# Patient Record
Sex: Female | Born: 1935 | Race: White | Hispanic: No | Smoking: Never smoker
Health system: Southern US, Community
[De-identification: ages and names within clinical notes are randomized; demographics above are authoritative.]

## PROBLEM LIST (undated history)

## (undated) DIAGNOSIS — Z950 Presence of cardiac pacemaker: Secondary | ICD-10-CM

## (undated) DIAGNOSIS — I10 Essential (primary) hypertension: Secondary | ICD-10-CM

## (undated) DIAGNOSIS — I499 Cardiac arrhythmia, unspecified: Secondary | ICD-10-CM

## (undated) DIAGNOSIS — I639 Cerebral infarction, unspecified: Secondary | ICD-10-CM

## (undated) DIAGNOSIS — I2510 Atherosclerotic heart disease of native coronary artery without angina pectoris: Secondary | ICD-10-CM

## (undated) HISTORY — PX: PACEMAKER INSERTION: SHX728

---

## 2005-09-26 ENCOUNTER — Other Ambulatory Visit: Payer: Self-pay

## 2005-09-26 ENCOUNTER — Inpatient Hospital Stay: Payer: Self-pay | Admitting: Internal Medicine

## 2005-09-30 ENCOUNTER — Other Ambulatory Visit: Payer: Self-pay

## 2005-10-19 ENCOUNTER — Encounter: Payer: Self-pay | Admitting: Unknown Physician Specialty

## 2005-11-29 ENCOUNTER — Emergency Department: Payer: Self-pay | Admitting: Emergency Medicine

## 2006-01-28 ENCOUNTER — Ambulatory Visit: Payer: Self-pay | Admitting: Internal Medicine

## 2007-02-04 ENCOUNTER — Ambulatory Visit: Payer: Self-pay | Admitting: Internal Medicine

## 2008-02-09 ENCOUNTER — Ambulatory Visit: Payer: Self-pay | Admitting: Internal Medicine

## 2009-02-11 ENCOUNTER — Ambulatory Visit: Payer: Self-pay | Admitting: Internal Medicine

## 2010-02-15 ENCOUNTER — Ambulatory Visit: Payer: Self-pay | Admitting: Internal Medicine

## 2011-02-20 ENCOUNTER — Ambulatory Visit: Payer: Self-pay | Admitting: Internal Medicine

## 2012-02-08 ENCOUNTER — Ambulatory Visit: Payer: Self-pay | Admitting: Ophthalmology

## 2012-02-08 DIAGNOSIS — I10 Essential (primary) hypertension: Secondary | ICD-10-CM

## 2012-02-08 LAB — PROTIME-INR: INR: 2.2

## 2012-02-08 LAB — POTASSIUM: Potassium: 3.7 mmol/L (ref 3.5–5.1)

## 2012-02-20 ENCOUNTER — Ambulatory Visit: Payer: Self-pay | Admitting: Ophthalmology

## 2012-03-05 ENCOUNTER — Ambulatory Visit: Payer: Self-pay | Admitting: Internal Medicine

## 2013-03-06 ENCOUNTER — Ambulatory Visit: Payer: Self-pay | Admitting: Internal Medicine

## 2013-04-09 ENCOUNTER — Ambulatory Visit: Payer: Self-pay | Admitting: Cardiology

## 2013-04-09 LAB — BASIC METABOLIC PANEL
Anion Gap: 6 — ABNORMAL LOW (ref 7–16)
Calcium, Total: 9.3 mg/dL (ref 8.5–10.1)
Chloride: 96 mmol/L — ABNORMAL LOW (ref 98–107)
Co2: 30 mmol/L (ref 21–32)
EGFR (African American): >60
Glucose: 98 mg/dL (ref 65–99)
Potassium: 3.7 mmol/L (ref 3.5–5.1)
Sodium: 132 mmol/L — ABNORMAL LOW (ref 136–145)

## 2013-04-09 LAB — CBC WITH DIFFERENTIAL/PLATELET
Basophil #: 0.1 10*3/uL (ref 0.0–0.1)
Basophil %: 0.9 %
Eosinophil #: 0.1 10*3/uL (ref 0.0–0.7)
HCT: 39 % (ref 35.0–47.0)
HGB: 13.6 g/dL (ref 12.0–16.0)
Lymphocyte #: 2.2 10*3/uL (ref 1.0–3.6)
MCH: 32.4 pg (ref 26.0–34.0)
MCV: 93 fL (ref 80–100)
Monocyte #: 0.5 x10 3/mm (ref 0.2–0.9)
Monocyte %: 6.8 %
Neutrophil #: 5 10*3/uL (ref 1.4–6.5)
Neutrophil %: 63.2 %
RDW: 12.7 % (ref 11.5–14.5)
WBC: 7.9 10*3/uL (ref 3.6–11.0)

## 2013-04-09 LAB — PROTIME-INR
INR: 2.9
Prothrombin Time: 29.6 secs — ABNORMAL HIGH (ref 11.5–14.7)

## 2013-04-09 LAB — APTT: Activated PTT: 44.6 secs — ABNORMAL HIGH (ref 23.6–35.9)

## 2013-04-16 ENCOUNTER — Ambulatory Visit: Payer: Self-pay | Admitting: Cardiology

## 2014-02-11 DIAGNOSIS — I4891 Unspecified atrial fibrillation: Secondary | ICD-10-CM | POA: Insufficient documentation

## 2014-03-10 ENCOUNTER — Ambulatory Visit: Payer: Self-pay | Admitting: Internal Medicine

## 2014-04-28 DIAGNOSIS — G459 Transient cerebral ischemic attack, unspecified: Secondary | ICD-10-CM | POA: Insufficient documentation

## 2014-04-28 DIAGNOSIS — Z950 Presence of cardiac pacemaker: Secondary | ICD-10-CM | POA: Insufficient documentation

## 2014-04-28 DIAGNOSIS — I10 Essential (primary) hypertension: Secondary | ICD-10-CM | POA: Insufficient documentation

## 2014-10-11 ENCOUNTER — Ambulatory Visit: Payer: Self-pay | Admitting: Otolaryngology

## 2014-11-01 DIAGNOSIS — E7800 Pure hypercholesterolemia, unspecified: Secondary | ICD-10-CM | POA: Insufficient documentation

## 2014-11-01 DIAGNOSIS — I495 Sick sinus syndrome: Secondary | ICD-10-CM | POA: Insufficient documentation

## 2014-11-01 DIAGNOSIS — Z8673 Personal history of transient ischemic attack (TIA), and cerebral infarction without residual deficits: Secondary | ICD-10-CM | POA: Insufficient documentation

## 2015-02-04 NOTE — Op Note (Signed)
PATIENT NAME:  [name], Brooke Potts DATE OF BIRTH:  [birth year]  DATE OF PROCEDURE:  04/16/2013  PRIMARY CARE PHYSICIAN:  Dr. Dan HumphreysWalker.   PREPROCEDURAL DIAGNOSES: 1.  Sick sinus syndrome.  2.  Elective replacement indication.   PROCEDURE:  Dual-chamber pacemaker generator change out.   POSTPROCEDURE DIAGNOSIS:  Intermittent ventricular pacing.   INDICATION:  The patient is an 81 year old female status post dual-chamber pacemaker in 1998 for sick sinus syndrome. Recent pacemaker interrogation has shown the pacemaker was at VVI pacing and at elective replacement indication. The procedure, the risks, benefits and alternatives of pacemaker generator change out were explained and informed written consent was obtained.   DESCRIPTION OF PROCEDURE:  She was brought to the Operating Room in a fasting state. The left pectoral region was prepped and draped in the usual sterile manner. Anesthesia was obtained with 1% Xylocaine locally. A 6 cm incision was performed over the old pacemaker generator site. The pacemaker generator was retrieved by electrocautery and blunt dissection. The leads were disconnected from the old pacemaker generator and interrogated. After proper thresholds were obtained, the leads were connected to a new rate-responsive pacemaker generator (Medtronic Adapta [id number]). The pacemaker pocket was irrigated with gentamicin solution. The pacemaker generator was positioned into the pocket. The pocket was closed with 2-0 and 4-0 Vicryl, respectively. Steri-Strips and pressure dressing were applied.   ____________________________ [name], MD ap:jm D: 04/16/2013 10:53:21 ET T: 04/16/2013 11:06:41 ET JOB#: [id number]  cc: [name], MD, <Dictator> [name] MD ELECTRONICALLY SIGNED 05/05/2013 12:28

## 2015-02-06 NOTE — Op Note (Signed)
PATIENT NAME:  Brooke GibbonMCDONALD, Jadie M MR#:  [id number] DATE OF BIRTH:  07/22/1923  DATE OF PROCEDURE:  02/20/2012  PREOPERATIVE DIAGNOSIS:  Senile cataract right eye.  POSTOPERATIVE DIAGNOSIS:  Senile cataract right eye.  PROCEDURE:  Phacoemulsification with posterior chamber intraocular lens implantation of the right eye.  LENS: ZCB00 21.5 diopter posterior chamber intraocular lens.  ULTRASOUND TIME:  13% of 1 minutes, 19 seconds for CDE 10.2.  SURGEON:  [place]had Janissa Bertram, MD  ANESTHESIA:  Topical with tetracaine drops and 2% Xylocaine jelly.  COMPLICATIONS:  None.  DESCRIPTION OF PROCEDURE:  The patient was identified in the holding room and transported to the operating room and placed in the supine position under the operating microscope.  The right eye was identified as the operative eye and it was prepped and draped in the usual sterile ophthalmic fashion.  A 1 millimeter clear-corneal paracentesis was made at the 12 o'clock  position.  The anterior chamber was filled with Viscoat viscoelastic.  A 2.4 millimeter keratome was used to make a near-clear corneal incision at the 9 o'clock  position.  A curvilinear capsulorrhexis was made with a cystotome and capsulorrhexis forceps.  Balanced salt solution was used to hydrodissect and hydrodelineate the nucleus.  Phacoemulsification was then used in horizontal chopping fashion to remove the lens nucleus and epinucleus.  The remaining cortex was then removed using the irrigation and aspiration handpiece. Provisc was then placed into the capsular bag to distend it for lens placement.  A ZCB00 21.5 diopter lens was then injected into the capsular bag.  The remaining viscoelastic was aspirated.  Wounds were hydrated with balanced salt solution.  The anterior chamber was inflated to a physiologic pressure with balanced salt solution.  Miostat was placed into the anterior chamber to constrict the pupil. No wound leaks were noted.  Topical Vigamox drops and  Maxitrol ointment were applied to the eye.  The patient was taken to the recovery room in stable condition without complications of anesthesia or surgery.  ____________________________ Deirdre Evenerhadwick R. Norton Bivins, MD crb:slb D: 02/20/2012 14:17:58 ET T: 02/20/2012 14:47:54 ET JOB#: [id number]  cc: Deirdre Evenerhadwick R. Rahkeem Senft, MD, <Dictator> Lockie MolaHADWICK Erica Osuna MD ELECTRONICALLY SIGNED 03/04/2012 11:32

## 2015-02-21 ENCOUNTER — Other Ambulatory Visit: Payer: Self-pay

## 2015-02-21 DIAGNOSIS — Z1231 Encounter for screening mammogram for malignant neoplasm of breast: Secondary | ICD-10-CM

## 2015-03-15 ENCOUNTER — Other Ambulatory Visit: Payer: Self-pay

## 2015-03-15 ENCOUNTER — Ambulatory Visit
Admission: RE | Admit: 2015-03-15 | Discharge: 2015-03-15 | Disposition: A | Discharge status: Home / Self Care | Payer: Medicare Other | Source: Ambulatory Visit | Attending: Internal Medicine | Admitting: Internal Medicine

## 2015-03-15 DIAGNOSIS — Z1231 Encounter for screening mammogram for malignant neoplasm of breast: Secondary | ICD-10-CM | POA: Insufficient documentation

## 2015-05-04 DIAGNOSIS — R42 Dizziness and giddiness: Secondary | ICD-10-CM | POA: Insufficient documentation

## 2016-02-06 ENCOUNTER — Other Ambulatory Visit: Payer: Self-pay

## 2016-02-06 ENCOUNTER — Other Ambulatory Visit: Payer: Self-pay | Admitting: Internal Medicine

## 2016-02-06 DIAGNOSIS — Z1231 Encounter for screening mammogram for malignant neoplasm of breast: Secondary | ICD-10-CM

## 2016-03-15 ENCOUNTER — Other Ambulatory Visit: Payer: Self-pay | Admitting: Internal Medicine

## 2016-03-15 ENCOUNTER — Ambulatory Visit
Admission: RE | Admit: 2016-03-15 | Discharge: 2016-03-15 | Disposition: A | Discharge status: Home / Self Care | Payer: Medicare Other | Source: Ambulatory Visit | Attending: Internal Medicine | Admitting: Internal Medicine

## 2016-03-15 DIAGNOSIS — Z1231 Encounter for screening mammogram for malignant neoplasm of breast: Secondary | ICD-10-CM

## 2016-10-02 ENCOUNTER — Other Ambulatory Visit: Payer: Self-pay | Admitting: Physician Assistant

## 2016-10-02 ENCOUNTER — Ambulatory Visit
Admission: RE | Admit: 2016-10-02 | Discharge: 2016-10-02 | Disposition: A | Discharge status: Home / Self Care | Payer: Medicare Other | Source: Ambulatory Visit | Attending: Physician Assistant | Admitting: Physician Assistant

## 2016-10-02 DIAGNOSIS — G319 Degenerative disease of nervous system, unspecified: Secondary | ICD-10-CM | POA: Insufficient documentation

## 2016-10-02 DIAGNOSIS — I739 Peripheral vascular disease, unspecified: Secondary | ICD-10-CM | POA: Insufficient documentation

## 2016-10-02 DIAGNOSIS — G9389 Other specified disorders of brain: Secondary | ICD-10-CM | POA: Insufficient documentation

## 2016-10-02 DIAGNOSIS — R519 Headache, unspecified: Secondary | ICD-10-CM

## 2016-10-02 DIAGNOSIS — R51 Headache: Secondary | ICD-10-CM | POA: Diagnosis not present

## 2016-10-02 DIAGNOSIS — Z8673 Personal history of transient ischemic attack (TIA), and cerebral infarction without residual deficits: Secondary | ICD-10-CM | POA: Diagnosis not present

## 2016-12-05 ENCOUNTER — Encounter: Payer: Self-pay | Admitting: *Deleted

## 2016-12-05 ENCOUNTER — Inpatient Hospital Stay
Admission: EM | Admit: 2016-12-05 | Discharge: 2016-12-11 | DRG: 378 | Disposition: A | Discharge status: Home / Self Care | Payer: Medicare Other | Attending: Internal Medicine | Admitting: Internal Medicine

## 2016-12-05 DIAGNOSIS — D62 Acute posthemorrhagic anemia: Secondary | ICD-10-CM | POA: Diagnosis present

## 2016-12-05 DIAGNOSIS — Z8249 Family history of ischemic heart disease and other diseases of the circulatory system: Secondary | ICD-10-CM

## 2016-12-05 DIAGNOSIS — H409 Unspecified glaucoma: Secondary | ICD-10-CM | POA: Diagnosis present

## 2016-12-05 DIAGNOSIS — K921 Melena: Secondary | ICD-10-CM

## 2016-12-05 DIAGNOSIS — Z950 Presence of cardiac pacemaker: Secondary | ICD-10-CM

## 2016-12-05 DIAGNOSIS — R791 Abnormal coagulation profile: Secondary | ICD-10-CM | POA: Diagnosis present

## 2016-12-05 DIAGNOSIS — I10 Essential (primary) hypertension: Secondary | ICD-10-CM | POA: Diagnosis present

## 2016-12-05 DIAGNOSIS — F419 Anxiety disorder, unspecified: Secondary | ICD-10-CM | POA: Diagnosis present

## 2016-12-05 DIAGNOSIS — Z823 Family history of stroke: Secondary | ICD-10-CM | POA: Diagnosis not present

## 2016-12-05 DIAGNOSIS — I2510 Atherosclerotic heart disease of native coronary artery without angina pectoris: Secondary | ICD-10-CM | POA: Diagnosis present

## 2016-12-05 DIAGNOSIS — K5731 Diverticulosis of large intestine without perforation or abscess with bleeding: Secondary | ICD-10-CM | POA: Diagnosis present

## 2016-12-05 DIAGNOSIS — Z7901 Long term (current) use of anticoagulants: Secondary | ICD-10-CM

## 2016-12-05 DIAGNOSIS — Z833 Family history of diabetes mellitus: Secondary | ICD-10-CM | POA: Diagnosis not present

## 2016-12-05 DIAGNOSIS — I4891 Unspecified atrial fibrillation: Secondary | ICD-10-CM | POA: Diagnosis present

## 2016-12-05 DIAGNOSIS — K922 Gastrointestinal hemorrhage, unspecified: Secondary | ICD-10-CM | POA: Diagnosis present

## 2016-12-05 DIAGNOSIS — D649 Anemia, unspecified: Secondary | ICD-10-CM

## 2016-12-05 HISTORY — DX: Cardiac arrhythmia, unspecified: I49.9

## 2016-12-05 HISTORY — DX: Atherosclerotic heart disease of native coronary artery without angina pectoris: I25.10

## 2016-12-05 HISTORY — DX: Essential (primary) hypertension: I10

## 2016-12-05 HISTORY — DX: Presence of cardiac pacemaker: Z95.0

## 2016-12-05 LAB — CBC
HCT: 22.1 % — ABNORMAL LOW (ref 35.0–47.0)
HEMOGLOBIN: 7.3 g/dL — AB (ref 12.0–16.0)
MCH: 30.7 pg (ref 26.0–34.0)
MCHC: 33.3 g/dL (ref 32.0–36.0)
MCV: 92.3 fL (ref 80.0–100.0)
Platelets: 372 10*3/uL (ref 150–440)
RBC: 2.39 MIL/uL — ABNORMAL LOW (ref 3.80–5.20)
RDW: 13.5 % (ref 11.5–14.5)
WBC: 9.4 10*3/uL (ref 3.6–11.0)

## 2016-12-05 LAB — PROTIME-INR
INR: 3.86
Prothrombin Time: 38.9 seconds — ABNORMAL HIGH (ref 11.4–15.2)

## 2016-12-05 LAB — BASIC METABOLIC PANEL
Anion gap: 9 (ref 5–15)
BUN: 34 mg/dL — ABNORMAL HIGH (ref 6–20)
CO2: 22 mmol/L (ref 22–32)
Calcium: 9 mg/dL (ref 8.9–10.3)
Chloride: 106 mmol/L (ref 101–111)
Creatinine, Ser: 1.25 mg/dL — ABNORMAL HIGH (ref 0.44–1.00)
GFR calc Af Amer: 42 mL/min — ABNORMAL LOW (ref >60)
GFR calc non Af Amer: 36 mL/min — ABNORMAL LOW (ref >60)
GLUCOSE: 129 mg/dL — AB (ref 65–99)
POTASSIUM: 4 mmol/L (ref 3.5–5.1)
Sodium: 137 mmol/L (ref 135–145)

## 2016-12-05 LAB — URINALYSIS, COMPLETE (UACMP) WITH MICROSCOPIC
Bilirubin Urine: NEGATIVE
Glucose, UA: NEGATIVE mg/dL
Ketones, ur: NEGATIVE mg/dL
NITRITE: NEGATIVE
PROTEIN: NEGATIVE mg/dL
Specific Gravity, Urine: 1.014 (ref 1.005–1.030)
pH: 6 (ref 5.0–8.0)

## 2016-12-05 LAB — GLUCOSE, CAPILLARY: GLUCOSE-CAPILLARY: 124 mg/dL — AB (ref 65–99)

## 2016-12-05 LAB — PREPARE RBC (CROSSMATCH)

## 2016-12-05 LAB — ABO/RH: ABO/RH(D): O POS

## 2016-12-05 MED ORDER — LISINOPRIL 20 MG PO TABS
20.0000 mg | ORAL_TABLET | Freq: Every day | ORAL | Status: DC
  Administered 2016-12-06 – 2016-12-10 (×4): 20 mg via ORAL
  Filled 2016-12-05 (×5): qty 1

## 2016-12-05 MED ORDER — SODIUM CHLORIDE 0.9 % IV SOLN
10.0000 mL/h | Freq: Once | INTRAVENOUS | Status: AC
  Administered 2016-12-05: 10 mL/h via INTRAVENOUS

## 2016-12-05 MED ORDER — LISINOPRIL-HYDROCHLOROTHIAZIDE 20-25 MG PO TABS: 1.0000 | ORAL_TABLET | Freq: Every day | ORAL | Status: DC

## 2016-12-05 MED ORDER — PANTOPRAZOLE SODIUM 40 MG IV SOLR: 40.0000 mg | Freq: Two times a day (BID) | INTRAVENOUS | Status: DC

## 2016-12-05 MED ORDER — ONDANSETRON HCL 4 MG PO TABS: 4.0000 mg | ORAL_TABLET | Freq: Four times a day (QID) | ORAL | Status: DC | PRN

## 2016-12-05 MED ORDER — ALPRAZOLAM 0.25 MG PO TABS
0.2500 mg | ORAL_TABLET | Freq: Three times a day (TID) | ORAL | Status: DC | PRN
  Administered 2016-12-05 – 2016-12-10 (×7): 0.25 mg via ORAL
  Filled 2016-12-05 (×7): qty 1

## 2016-12-05 MED ORDER — ACETAMINOPHEN 650 MG RE SUPP
650.0000 mg | Freq: Four times a day (QID) | RECTAL | Status: DC | PRN
Start: 2016-12-05 — End: 2016-12-11

## 2016-12-05 MED ORDER — ONDANSETRON HCL 4 MG/2ML IJ SOLN: 4.0000 mg | Freq: Four times a day (QID) | INTRAMUSCULAR | Status: DC | PRN

## 2016-12-05 MED ORDER — LATANOPROST 0.005 % OP SOLN
1.0000 [drp] | Freq: Every day | OPHTHALMIC | Status: DC
  Administered 2016-12-05 – 2016-12-10 (×6): 1 [drp] via OPHTHALMIC
  Filled 2016-12-05: qty 2.5

## 2016-12-05 MED ORDER — PANTOPRAZOLE SODIUM 40 MG IV SOLR
40.0000 mg | Freq: Two times a day (BID) | INTRAVENOUS | Status: DC
  Administered 2016-12-05 – 2016-12-10 (×10): 40 mg via INTRAVENOUS
  Filled 2016-12-05 (×11): qty 40

## 2016-12-05 MED ORDER — SODIUM CHLORIDE 0.9 % IV SOLN
INTRAVENOUS | Status: DC
  Administered 2016-12-05 – 2016-12-08 (×4): via INTRAVENOUS

## 2016-12-05 MED ORDER — ACETAMINOPHEN 325 MG PO TABS
650.0000 mg | ORAL_TABLET | Freq: Four times a day (QID) | ORAL | Status: DC | PRN
  Administered 2016-12-05 – 2016-12-10 (×6): 650 mg via ORAL
  Filled 2016-12-05 (×6): qty 2

## 2016-12-05 MED ORDER — HYDROCHLOROTHIAZIDE 25 MG PO TABS
25.0000 mg | ORAL_TABLET | Freq: Every day | ORAL | Status: DC
  Administered 2016-12-06 – 2016-12-10 (×4): 25 mg via ORAL
  Filled 2016-12-05 (×5): qty 1

## 2016-12-05 NOTE — ED Triage Notes (Signed)
Pt complains of generalized weakness, pt states" I feel like my muscles left my body"

## 2016-12-05 NOTE — H&P (Signed)
[facility] [place] [facility]   PATIENT NAME: Brooke SeenLula Potts    MR#:  [id number]  DATE OF BIRTH:  25-Jul-1923  DATE OF ADMISSION:  12/05/2016  PRIMARY CARE PHYSICIAN: Rafael BihariWALKER III, JOHN B, MD   REQUESTING/REFERRING PHYSICIAN: Dr. Daryel NovemberJonathan Williams  CHIEF COMPLAINT:   Chief Complaint  Patient presents with  . Fatigue    HISTORY OF PRESENT ILLNESS:  Brooke Potts  is a 83 y.o. female with a known history of Coronary artery disease, hypertension, history of atrial fibrillation status post pacemaker who presents to the hospital due to weakness, fatigue ongoing for the past few weeks. Patient says that she gets significantly short of breath when walking up her stairs and also gets dizzy and lightheaded. She went to see her primary care physician today who did some routine blood work and noted her to be significantly anemic and sent her to the ER for further evaluation. Patient does state that she's been seeing dark/melanotic stools now for a few weeks. She denies any abdominal pain, chest pain, nausea vomiting or any other associated symptoms present. She was noted to be significantly anemic and symptomatic with it and suspected to have an upper GI bleed and hospitalist services were contacted further treatment and evaluation.  PAST MEDICAL HISTORY:   Past Medical History:  Diagnosis Date  . Coronary artery disease   . Hypertension   . Pacemaker     PAST SURGICAL HISTORY:   Past Surgical History:  Procedure Laterality Date  . PACEMAKER INSERTION      SOCIAL HISTORY:   Social History  Substance Use Topics  . Smoking status: Never Smoker  . Smokeless tobacco: Not on file  . Alcohol use No    FAMILY HISTORY:   Family History  Problem Relation Age of Onset  . Breast cancer Mother [id number]  . Peripheral vascular disease Mother   . Diabetes Mother   . Stroke Father   . Diabetes Father   . Diabetes Sister   . Diabetes Brother     DRUG ALLERGIES:  No Known  Allergies  REVIEW OF SYSTEMS:   Review of Systems  Constitutional: Positive for malaise/fatigue. Negative for fever and weight loss.  HENT: Negative for congestion, nosebleeds and tinnitus.   Eyes: Negative for blurred vision, double vision and redness.  Respiratory: Negative for cough, hemoptysis and shortness of breath.   Cardiovascular: Negative for chest pain, orthopnea, leg swelling and PND.  Gastrointestinal: Positive for blood in stool and melena. Negative for abdominal pain, diarrhea, nausea and vomiting.  Genitourinary: Negative for dysuria, hematuria and urgency.  Musculoskeletal: Negative for falls and joint pain.  Neurological: Positive for dizziness and weakness. Negative for tingling, sensory change, focal weakness, seizures and headaches.  Endo/Heme/Allergies: Negative for polydipsia. Does not bruise/bleed easily.  Psychiatric/Behavioral: Negative for depression and memory loss. The patient is not nervous/anxious.     MEDICATIONS AT HOME:   Prior to Admission medications   Medication Sig Start Date End Date Taking? Authorizing Provider  acetaminophen (TYLENOL) 325 MG tablet Take 650 mg by mouth every 6 (six) hours as needed.   Yes Historical Provider, MD  ALPRAZolam (XANAX) 0.25 MG tablet Take 0.25 mg by mouth 3 (three) times daily as needed. 10/13/16  Yes Historical Provider, MD  Cranberry 450 MG TABS Take 450 mg by mouth 2 (two) times daily.   Yes Historical Provider, MD  latanoprost (XALATAN) 0.005 % ophthalmic solution Place 1 drop into both eyes at bedtime. 09/19/14  Yes Historical  Provider, MD  lisinopril-hydrochlorothiazide (PRINZIDE,ZESTORETIC) 20-25 MG tablet Take 1 tablet by mouth daily. 11/22/16  Yes Historical Provider, MD  warfarin (COUMADIN) 2 MG tablet Take 2 mg by mouth daily. 10/16/16  Yes Historical Provider, MD      VITAL SIGNS:  Blood pressure 135/68, pulse (!) 105, temperature 97.8 F (36.6 C), temperature source Oral, resp. rate (!) 28, height 5'  (1.524 m), weight 52.6 kg (116 lb), SpO2 100 %.  PHYSICAL EXAMINATION:  Physical Exam  GENERAL:  83 y.o.-year-old patient lying in the bed in no acute distress.  EYES: Pupils equal, round, reactive to light and accommodation. No scleral icterus. Extraocular muscles intact. Pale conjunctiva.  HEENT: Head atraumatic, normocephalic. Oropharynx and nasopharynx clear. No oropharyngeal erythema, moist oral mucosa  NECK:  Supple, no jugular venous distention. No thyroid enlargement, no tenderness.  LUNGS: Normal breath sounds bilaterally, no wheezing, rales, rhonchi. No use of accessory muscles of respiration.  CARDIOVASCULAR: S1, S2 RRR. No murmurs, rubs, gallops, clicks.  ABDOMEN: Soft, nontender, nondistended. Bowel sounds present. No organomegaly or mass.  EXTREMITIES: No pedal edema, cyanosis, or clubbing. + 2 pedal & radial pulses b/l.   NEUROLOGIC: Cranial nerves II through XII are intact. No focal Motor or sensory deficits appreciated b/l. Globally weak PSYCHIATRIC: The patient is alert and oriented x 3. SKIN: No obvious rash, lesion, or ulcer.   LABORATORY PANEL:   CBC  Recent Labs Lab 12/05/16 1033  WBC 9.4  HGB 7.3*  HCT 22.1*  PLT 372   ------------------------------------------------------------------------------------------------------------------  Chemistries   Recent Labs Lab 12/05/16 1033  NA 137  K 4.0  CL 106  CO2 22  GLUCOSE 129*  BUN 34*  CREATININE 1.25*  CALCIUM 9.0   ------------------------------------------------------------------------------------------------------------------  Cardiac Enzymes No results for input(s): TROPONINI in the last 168 hours. ------------------------------------------------------------------------------------------------------------------  RADIOLOGY:  No results found.   IMPRESSION AND PLAN:   83 year old female with past medical history of hypertension, atrial fibrillation status post pacemaker me anxiety, glaucoma  who presents to the hospital due to weakness and dizziness and noted to be anemic and also noted to have heme positive stools.  1. GI bleed-this is a suspected upper GI bleed given the patient's melanotic stools. -Given the patient symptomatically anemia she'll be transfused 1 unit of packed red blood cells. Follow hemoglobin. -Place on IV Protonix, clear liquid diet. I will get a gastroenterology consult.  2. Anemia-this is acute blood loss anemia secondary to GI bleed.  -patient will be transfused 1 unit of packed red blood cells, will follow hemoglobin.  -hold Coumadin.  3. History of atrial fibrillation-patient is rate controlled and is status post pacemaker -Hold Coumadin given the GI bleed and anemia.  4. Essential hypertension-continue a Lisinopril/HCTZ.  5. Glaucoma-continue latanoprost eyedrops  6. Anxiety-continue as needed Xanax.    All the records are reviewed and case discussed with ED provider. Management plans discussed with the patient, family and they are in agreement.  CODE STATUS: Full code  TOTAL TIME TAKING CARE OF THIS PATIENT: 45 minutes.    Houston Siren M.D on 12/05/2016 at 1:08 PM  Between 7am to 6pm - Pager - [phone]  After 6pm go to [email or web address] - password [email or web address] [facility]  [facility]  Office  [phone]  CC: Primary care physician; Rafael Bihari, MD

## 2016-12-05 NOTE — ED Notes (Signed)
[facility]  Pt alert.  Pt waiting on bed assignment.  Family with pt.

## 2016-12-05 NOTE — ED Notes (Signed)
ED Provider at bedside. 

## 2016-12-05 NOTE — ED Notes (Signed)
Rectal exam performed by Dr.Williams, assisted by medic Tammy J

## 2016-12-05 NOTE — ED Provider Notes (Addendum)
[facility] Emergency Department Provider Note        Time seen: ----------------------------------------- 10:45 AM on 12/05/2016 -----------------------------------------    I have reviewed the triage vital signs and the nursing notes.   HISTORY  Chief Complaint Fatigue    HPI Brooke Potts is a 83 y.o. female who presents to ER for generalized weakness, states she feels like muscles left her body.Patient describes progressive symptom onset that has been there for "a while". Patient denies fevers, chills, chest pain but does have shortness of breath particularly with ambulation. Patient states she can't walk to the bathroom without being short winded. She denies fevers, chills or other complaints. She also notes she's had black stools for "a while".   Past Medical History:  Diagnosis Date  . Coronary artery disease   . Hypertension     There are no active problems to display for this patient.   Past Surgical History:  Procedure Laterality Date  . PACEMAKER INSERTION      Allergies Patient has no known allergies.  Social History Social History  Substance Use Topics  . Smoking status: Never Smoker  . Smokeless tobacco: Not on file  . Alcohol use No    Review of Systems Constitutional: Negative for fever. Cardiovascular: Negative for chest pain. Respiratory: Positive for shortness of breath Gastrointestinal: Negative for abdominal pain, vomiting and diarrhea.Positive for black stools Genitourinary: Negative for dysuria. Musculoskeletal: Negative for back pain. Skin: Negative for rash. Neurological: Negative for headaches, positive for weakness  10-point ROS otherwise negative.  ____________________________________________   PHYSICAL EXAM:  VITAL SIGNS: ED Triage Vitals  Enc Vitals Group     BP 12/05/16 1026 135/68     Pulse Rate 12/05/16 1027 (!) 101     Resp --      Temp 12/05/16 1028 97.8 F (36.6 C)     Temp Source  12/05/16 1028 Oral     SpO2 12/05/16 1027 100 %     Weight 12/05/16 1029 116 lb (52.6 kg)     Height 12/05/16 1029 5' (1.524 m)     Head Circumference --      Peak Flow --      Pain Score --      Pain Loc --      Pain Edu? --      Excl. in GC? --     Constitutional: Alert and oriented. Well appearing and in no distress. Eyes: Conjunctivae are Pale. PERRL. Normal extraocular movements. ENT   Head: Normocephalic and atraumatic.   Nose: No congestion/rhinnorhea.   Mouth/Throat: Mucous membranes are moist.   Neck: No stridor. Cardiovascular: Irregularly irregular rhythm. No murmurs, rubs, or gallops. Respiratory: Normal respiratory effort without tachypnea nor retractions. Breath sounds are clear and equal bilaterally. No wheezes/rales/rhonchi. Gastrointestinal: Soft and nontender. Normal bowel sounds Rectal: Hard stool, black, heme positive Musculoskeletal: Nontender with normal range of motion in all extremities. No lower extremity tenderness nor edema. Neurologic:  Normal speech and language. No gross focal neurologic deficits are appreciated.  Skin:  Skin is warm, dry and intact. Pallor is noted Psychiatric: Mood and affect are normal. Speech and behavior are normal.  ____________________________________________  EKG: Interpreted by me. Atrial fibrillation with ventricular paced complexes  ____________________________________________  ED COURSE:  Pertinent labs & imaging results that were available during my care of the patient were reviewed by me and considered in my medical decision making (see chart for details). Patient presents to ER with weakness. We will assess with  labs and imaging.   Procedures ____________________________________________   LABS (pertinent positives/negatives)  Labs Reviewed  BASIC METABOLIC PANEL - Abnormal; Notable for the following:       Result Value   Glucose, Bld 129 (*)    BUN 34 (*)    Creatinine, Ser 1.25 (*)    GFR calc  non Af Amer 36 (*)    GFR calc Af Amer 42 (*)    All other components within normal limits  CBC - Abnormal; Notable for the following:    RBC 2.39 (*)    Hemoglobin 7.3 (*)    HCT 22.1 (*)    All other components within normal limits  URINALYSIS, COMPLETE (UACMP) WITH MICROSCOPIC - Abnormal; Notable for the following:    Color, Urine YELLOW (*)    APPearance CLOUDY (*)    Hgb urine dipstick LARGE (*)    Leukocytes, UA LARGE (*)    Bacteria, UA MANY (*)    Squamous Epithelial / LPF 6-30 (*)    All other components within normal limits  GLUCOSE, CAPILLARY - Abnormal; Notable for the following:    Glucose-Capillary 124 (*)    All other components within normal limits  PROTIME-INR - Abnormal; Notable for the following:    Prothrombin Time 38.9 (*)    All other components within normal limits  CBG MONITORING, ED  TYPE AND SCREEN  TYPE AND SCREEN   ____________________________________________  FINAL ASSESSMENT AND PLAN  Weakness, anemia, Heme positive stool  Plan: Patient with labs and imaging as dictated above. Patient likely with upper GI bleeding complicated by or made worse by concurrent use of Coumadin for atrial fibrillation. She will need to be admitted, holding Coumadin and consider blood transfusion. I will discuss with the hospitalist for admission.   Emily FilbertWilliams, Jonathan E, MD   Note: This note was generated in part or whole with voice recognition software. Voice recognition is usually quite accurate but there are transcription errors that can and very often do occur. I apologize for any typographical errors that were not detected and corrected.     Emily FilbertJonathan E Williams, MD 12/05/16 1204    Emily FilbertJonathan E Williams, MD 12/05/16 [phone]

## 2016-12-05 NOTE — ED Notes (Signed)
Admit Provider at bedside. 

## 2016-12-05 NOTE — ED Notes (Addendum)
Generalized weakness "for months" per patient. "feels like my muscles have left my body". Active ROM. Pt drove herself here. Pt able to stand and get into stretcher.

## 2016-12-05 NOTE — ED Notes (Signed)
Recollect of Type and screen completed

## 2016-12-05 NOTE — ED Notes (Signed)
Informed consent, signed by pt , family at bedside to witness.

## 2016-12-05 NOTE — Consult Note (Signed)
Wyline MoodKiran Andalyn Heckstall MD  [address] [place], [place] [address] Phone: [phone] Fax : [phone]  Consultation  Referring Provider:     No ref. provider found Primary Care Physician:  Rafael BihariWALKER III, JOHN B, MD Primary Gastroenterologist:      None     Reason for Consultation:     GI bleed  Date of Admission:  12/05/2016 Date of Consultation:  12/05/2016         HPI:   Delman CheadleLula Mae Potts is a 83 y.o. female who is on coumadin for atrial fibrillation presented to the hospital today with weakness , fatigue for the past few weeks. I have been consulted because she mentioned that she had dark stools and was found to have a drop in her Hb to 7.3 grams from baseline of 13 a few years back on our records. MCV is normal . INR elevated 3.86   She says prior to 09/2016 she could walk 12 mils a day but since December getting more tired, unable to walk for long , noticed tarry black stools, which persisted. Denies use of any NSAID's, complains of palpitations on and off. No abdominal pain presently.   Past Medical History:  Diagnosis Date  . Coronary artery disease   . Hypertension   . Pacemaker     Past Surgical History:  Procedure Laterality Date  . PACEMAKER INSERTION      Prior to Admission medications   Medication Sig Start Date End Date Taking? Authorizing Provider  acetaminophen (TYLENOL) 325 MG tablet Take 650 mg by mouth every 6 (six) hours as needed.   Yes Historical Provider, MD  ALPRAZolam (XANAX) 0.25 MG tablet Take 0.25 mg by mouth 3 (three) times daily as needed. 10/13/16  Yes Historical Provider, MD  Cranberry 450 MG TABS Take 450 mg by mouth 2 (two) times daily.   Yes Historical Provider, MD  latanoprost (XALATAN) 0.005 % ophthalmic solution Place 1 drop into both eyes at bedtime. 09/19/14  Yes Historical Provider, MD  lisinopril-hydrochlorothiazide (PRINZIDE,ZESTORETIC) 20-25 MG tablet Take 1 tablet by mouth daily. 11/22/16  Yes Historical Provider, MD  warfarin (COUMADIN) 2 MG tablet  Take 2 mg by mouth daily. 10/16/16  Yes Historical Provider, MD    Family History  Problem Relation Age of Onset  . Breast cancer Mother 967  . Peripheral vascular disease Mother   . Diabetes Mother   . Stroke Father   . Diabetes Father   . Diabetes Sister   . Diabetes Brother      Social History  Substance Use Topics  . Smoking status: Never Smoker  . Smokeless tobacco: Not on file  . Alcohol use No    Allergies as of 12/05/2016  . (No Known Allergies)    Review of Systems:    All systems reviewed and negative except where noted in HPI.   Physical Exam:  Vital signs in last 24 hours: Temp:  [97.8 F (36.6 C)] 97.8 F (36.6 C) (02/21 1028) Pulse Rate:  [86-105] 94 (02/21 1500) Resp:  [15-28] 18 (02/21 1500) BP: (135-145)/(61-68) 145/61 (02/21 1500) SpO2:  [100 %] 100 % (02/21 1500) Weight:  [116 lb (52.6 kg)] 116 lb (52.6 kg) (02/21 1029)   General:   Pleasant, cooperative in NAD, lying comfortably in her bed Head:  Normocephalic and atraumatic. Eyes:   No icterus.   Conjunctiva pink. PERRLA. Ears:  Normal auditory acuity. Neck:  Supple; no masses or thyroidomegaly Lungs: Respirations even and unlabored. Lungs clear to auscultation bilaterally.  No wheezes, crackles, or rhonchi.  Heart:  Irregularly irregular heart rate, systolic murmur heard over precordium Abdomen:  Soft, nondistended, nontender. Normal bowel sounds. No appreciable masses or hepatomegaly.  No rebound or guarding.  Rectal:  Not performed.  Extremities:  Without edema, cyanosis or clubbing. Neurologic:  Alert and oriented x3;  grossly normal neurologically. Skin:  Intact without significant lesions or rashes. Cervical Nodes:  No significant cervical adenopathy. Psych:  Alert and cooperative. Normal affect.  LAB RESULTS:  Recent Labs  12/05/16 1033  WBC 9.4  HGB 7.3*  HCT 22.1*  PLT 372   BMET  Recent Labs  12/05/16 1033  NA 137  K 4.0  CL 106  CO2 22  GLUCOSE 129*  BUN 34*    CREATININE 1.25*  CALCIUM 9.0   LFT No results for input(s): PROT, ALBUMIN, AST, ALT, ALKPHOS, BILITOT, BILIDIR, IBILI in the last 72 hours. PT/INR  Recent Labs  12/05/16 1116  LABPROT 38.9*  INR 3.86    STUDIES: No results found.    Impression / Plan:   Brooke Potts is a 83 y.o. y/o female admitted with symptomatic anemia , supratherapeutic inr due to coumadin for atrial fibrillation . History suggestive of an upper GI bleed.   Plan   1. IV PPI, clear liquid diet  2. H pylori stool antigen  3. Monitor CBC and transfuse as needed  4. Hold coumadin and when INR < 1.5 will discuss with patient about possible endoscopy .   5. Moving forward decision needs to be made about the risks of anticoagulation vs risk of stroke and her age .  Thank you for involving me in the care of this patient.      LOS: 0 days   Wyline Mood, MD  12/05/2016, 4:17 PM

## 2016-12-06 LAB — CBC
HCT: 22.6 % — ABNORMAL LOW (ref 35.0–47.0)
Hemoglobin: 7.8 g/dL — ABNORMAL LOW (ref 12.0–16.0)
MCH: 31.7 pg (ref 26.0–34.0)
MCHC: 34.7 g/dL (ref 32.0–36.0)
MCV: 91.2 fL (ref 80.0–100.0)
PLATELETS: 242 10*3/uL (ref 150–440)
RBC: 2.48 MIL/uL — AB (ref 3.80–5.20)
RDW: 13.3 % (ref 11.5–14.5)
WBC: 6.5 10*3/uL (ref 3.6–11.0)

## 2016-12-06 LAB — BASIC METABOLIC PANEL
Anion gap: 4 — ABNORMAL LOW (ref 5–15)
BUN: 27 mg/dL — ABNORMAL HIGH (ref 6–20)
CHLORIDE: 113 mmol/L — AB (ref 101–111)
CO2: 25 mmol/L (ref 22–32)
CREATININE: 1.11 mg/dL — AB (ref 0.44–1.00)
Calcium: 8.2 mg/dL — ABNORMAL LOW (ref 8.9–10.3)
GFR calc non Af Amer: 42 mL/min — ABNORMAL LOW (ref >60)
GFR, EST AFRICAN AMERICAN: 49 mL/min — AB (ref >60)
Glucose, Bld: 92 mg/dL (ref 65–99)
POTASSIUM: 3.9 mmol/L (ref 3.5–5.1)
SODIUM: 142 mmol/L (ref 135–145)

## 2016-12-06 LAB — PROTIME-INR
INR: 3.68
PROTHROMBIN TIME: 37.4 s — AB (ref 11.4–15.2)

## 2016-12-06 NOTE — Evaluation (Signed)
Physical Therapy Evaluation Patient Details Name: Brooke Potts MRN: [id number] DOB: 1922-10-16 Today's Date: 12/06/2016   History of Present Illness  Pt is a pleasant 83 yo female, has had increased weakness/fatigue with SOB and dizziness when walking up stairs the past few weeks admitted [facility] w/ GI bleed and anemia. PMH includes CAD, HTN, A-fib, s/p pacemaker    Clinical Impression  Pt awake, alert and willing to participate in PT eval. She demonstrated good functional strength for her age and was able to perform bed mobility and transfers independently under close PT supervision. She displayed minor safety awareness concerns and attempted to get up and walk w/o assistance but able to respond to cues to return to sitting. Pt able to safely ambulate around nursing station w/ RW, PT supervision and a chair follow due to complaints of feeling dizzy. She demonstrated good stability while ambulating w/ RW and was able to recognize need for intermittent standing rest breaks as needed. Discussed the importance of using a RW for improved overall balance and energy conservation during ambulation. Overall patient displays a moderate decrease in activity tolerance secondary to cardiopulmonary impairments that limit safe functional mobility. Pt will benefit from skilled PT to correct deficits above; recommend HHPT following acute hospital stay.     Follow Up Recommendations Home health PT    Equipment Recommendations  Other (comment) (4 wheel walker w/ seat to allow for sitting breaks to improve energy conservation)    Recommendations for Other Services       Precautions / Restrictions Precautions Precautions: Fall Restrictions Weight Bearing Restrictions: No      Mobility  Bed Mobility Overal bed mobility: Independent             General bed mobility comments: able to move from supine to sitting w/o difficulty   Transfers Overall transfer level: Needs assistance Equipment used:  Rolling walker (2 wheeled) Transfers: Sit to/from Stand Sit to Stand: Supervision         General transfer comment: able to stand w/o much difficulty, use of B UEs for lift off   Ambulation/Gait Ambulation/Gait assistance: Supervision;+2 safety/equipment Ambulation Distance (Feet): 200 Feet Assistive device: Rolling walker (2 wheeled) Gait Pattern/deviations: Trunk flexed;Step-through pattern     General Gait Details: ambulated around nursing station w/ chair follow for safety, pt able to safely use RW and required frequent standing breaks when SOB, pt demonstrated good safety ambulating and able to recognize need for standing rest breaks  Stairs            Wheelchair Mobility    Modified Rankin (Stroke Patients Only)       Balance Overall balance assessment: Needs assistance Sitting-balance support: No upper extremity supported Sitting balance-Leahy Scale: Good Sitting balance - Comments: slightly flexed trunk    Standing balance support: Bilateral upper extremity supported Standing balance-Leahy Scale: Good Standing balance comment: improved stability in stance w/ use of RW, slightly forward flexed trunk                              Pertinent Vitals/Pain Pain Assessment: No/denies pain    Home Living Family/patient expects to be discharged to:: Private residence Living Arrangements: Children (daughter) Available Help at Discharge: Family;Available PRN/intermittently Type of Home: House Home Access: Stairs to enter Entrance Stairs-Rails: None Entrance Stairs-Number of Steps: 2 Home Layout: Two level;Bed/bath upstairs Home Equipment: None      Prior Function Level of Independence: Needs assistance  Comments: typically independent in ADLs, IADLs, still drives, recently becomes SOB and dizzy when ascending and descending stairs, no reports of falls per patient      Hand Dominance        Extremity/Trunk Assessment   Upper  Extremity Assessment Upper Extremity Assessment: Overall WFL for tasks assessed    Lower Extremity Assessment Lower Extremity Assessment: Overall WFL for tasks assessed       Communication   Communication: No difficulties  Cognition Arousal/Alertness: Awake/alert Behavior During Therapy: WFL for tasks assessed/performed Overall Cognitive Status: Within Functional Limits for tasks assessed                      General Comments      Exercises     Assessment/Plan    PT Assessment Patient needs continued PT services  PT Problem List Decreased activity tolerance;Decreased balance;Decreased knowledge of use of DME;Decreased mobility;Cardiopulmonary status limiting activity;Decreased safety awareness       PT Treatment Interventions DME instruction;Gait training;Stair training;Functional mobility training;Therapeutic exercise;Therapeutic activities;Balance training;Patient/family education    PT Goals (Current goals can be found in the Care Plan section)  Acute Rehab PT Goals Patient Stated Goal: Return home PT Goal Formulation: With patient Time For Goal Achievement: 12/20/16 Potential to Achieve Goals: Good    Frequency Min 2X/week   Barriers to discharge Inaccessible home environment stairs     Co-evaluation               End of Session Equipment Utilized During Treatment: Gait belt Activity Tolerance: Patient tolerated treatment well Patient left: in chair;with call bell/phone within reach;with chair alarm set;with family/visitor present Nurse Communication: Mobility status PT Visit Diagnosis: Dizziness and giddiness (R42);Muscle weakness (generalized) (M62.81)         Time: 1610-9604 PT Time Calculation (min) (ACUTE ONLY): 22 min   Charges:         PT G Codes:         Raijon Lindfors Student PT 12/06/2016, 4:16 PM

## 2016-12-06 NOTE — Plan of Care (Signed)
Problem: Bowel/Gastric: Goal: Will show no signs and symptoms of gastrointestinal bleeding Outcome: Progressing Patient has not had BM since arrival to floor.

## 2016-12-06 NOTE — Progress Notes (Signed)
[employer] - [place] [facility]   PATIENT NAME: Brooke SeenLula Casserly    MR#:  [id number]  DATE OF BIRTH:  [birth year]  SUBJECTIVE:  CHIEF COMPLAINT:   Chief Complaint  Patient presents with  . Fatigue   Came fatigued for last one month, found to have dark stool and low Hb. INR high on coumadin. No more dark BM, tolerating diet.  REVIEW OF SYSTEMS:  CONSTITUTIONAL: No fever,positive for fatigue or weakness.  EYES: No blurred or double vision.  EARS, NOSE, AND THROAT: No tinnitus or ear pain.  RESPIRATORY: No cough, shortness of breath, wheezing or hemoptysis.  CARDIOVASCULAR: No chest pain, orthopnea, edema.  GASTROINTESTINAL: No nausea, vomiting, diarrhea or abdominal pain.  GENITOURINARY: No dysuria, hematuria.  ENDOCRINE: No polyuria, nocturia,  HEMATOLOGY: No anemia, easy bruising or bleeding SKIN: No rash or lesion. MUSCULOSKELETAL: No joint pain or arthritis.   NEUROLOGIC: No tingling, numbness, weakness.  PSYCHIATRY: No anxiety or depression.   ROS  DRUG ALLERGIES:  No Known Allergies  VITALS:  Blood pressure (!) 122/58, pulse 99, temperature 97.5 F (36.4 C), temperature source Oral, resp. rate 18, height 5' (1.524 m), weight 51.2 kg (112 lb 14.4 oz), SpO2 99 %.  PHYSICAL EXAMINATION:  GENERAL:  83 y.o.-year-old patient lying in the bed with no acute distress.  EYES: Pupils equal, round, reactive to light and accommodation. No scleral icterus. Extraocular muscles intact. Conjunctiva pale. HEENT: Head atraumatic, normocephalic. Oropharynx and nasopharynx clear.  NECK:  Supple, no jugular venous distention. No thyroid enlargement, no tenderness.  LUNGS: Normal breath sounds bilaterally, no wheezing, rales,rhonchi or crepitation. No use of accessory muscles of respiration.  CARDIOVASCULAR: S1, S2 normal. No murmurs, rubs, or gallops.  ABDOMEN: Soft, nontender, nondistended. Bowel sounds present. No organomegaly or mass.  EXTREMITIES: No pedal edema,  cyanosis, or clubbing.  NEUROLOGIC: Cranial nerves II through XII are intact. Muscle strength 4/5 in all extremities. Sensation intact. Gait not checked.  PSYCHIATRIC: The patient is alert and oriented x 3.  SKIN: No obvious rash, lesion, or ulcer.   Physical Exam LABORATORY PANEL:   CBC  Recent Labs Lab 12/06/16 0501  WBC 6.5  HGB 7.8*  HCT 22.6*  PLT 242   ------------------------------------------------------------------------------------------------------------------  Chemistries   Recent Labs Lab 12/06/16 0501  NA 142  K 3.9  CL 113*  CO2 25  GLUCOSE 92  BUN 27*  CREATININE 1.11*  CALCIUM 8.2*   ------------------------------------------------------------------------------------------------------------------  Cardiac Enzymes No results for input(s): TROPONINI in the last 168 hours. ------------------------------------------------------------------------------------------------------------------  RADIOLOGY:  No results found.  ASSESSMENT AND PLAN:   Active Problems:   GI bleed  83 year old female with past medical history of hypertension, atrial fibrillation status post pacemaker me anxiety, glaucoma who presents to the hospital due to weakness and dizziness and noted to be anemic and also noted to have heme positive stools.  1. GI bleed-this is a suspected upper GI bleed given the patient's melanotic stools. -Given the patient symptomatically anemia , transfused 1 unit of packed red blood cells. Follow hemoglobin, stable. - on IV Protonix, clear liquid diet- ulgrade to soft diet. - as per GI- EGD once INR is < 1.5.  2. Anemia-this is acute blood loss anemia secondary to GI bleed.  - transfused 1 unit of packed red blood cells, will follow hemoglobin.  -hold Coumadin.  3. History of atrial fibrillation-patient is rate controlled and is status post pacemaker -Hold Coumadin given the GI bleed and anemia. - explained pt's daughter that due to  GI bleed  , we will stop coumadin from now onwards. She agreed.  4. Essential hypertension-continue a Lisinopril/HCTZ.  5. Glaucoma-continue latanoprost eyedrops  6. Anxiety-continue as needed Xanax.   All the records are reviewed and case discussed with Care Management/Social Workerr. Management plans discussed with the patient, family and they are in agreement.  CODE STATUS: Full.  TOTAL TIME TAKING CARE OF THIS PATIENT: 35 minutes.    POSSIBLE D/C IN 1-2 DAYS, DEPENDING ON CLINICAL CONDITION.   Altamese Dilling M.D on 12/06/2016   Between 7am to 6pm - Pager - [phone]  After 6pm go to [email or web address] - password [email or web address]  [facility]  Office  [phone]  CC: Primary care physician; Rafael Bihari, MD  Note: This dictation was prepared with Dragon dictation along with smaller phrase technology. Any transcriptional errors that result from this process are unintentional.

## 2016-12-06 NOTE — Progress Notes (Signed)
  Brooke Potts Kya Mayfield MD [address] [place], [place] [address] Phone: [phone] Fax : [phone]  Brooke CheadleLula Mae Potts is being followed for GI bleed  Day 2 of follow up   Subjective: Doing well no BM's since coming into the hospital, she feels hungry   Objective: Vital signs in last 24 hours: Vitals:   12/05/16 1959 12/06/16 0551 12/06/16 0821 12/06/16 1010  BP: (!) 158/53 (!) 121/46 (!) 146/56 (!) 122/58  Pulse: 91 82 87 99  Resp:  16 18   Temp: 97.4 F (36.3 C) 97.9 F (36.6 C) 97.5 F (36.4 C)   TempSrc: Oral Oral Oral   SpO2: 100% 99% 100% 99%  Weight:      Height:       Weight change:   Intake/Output Summary (Last 24 hours) at 12/06/16 1039 Last data filed at 12/06/16 1031  Gross per 24 hour  Intake             1975 ml  Output              100 ml  Net             1875 ml     Exam: Heart:: irregularly irregular heart beat  Lungs: normal, clear to auscultation and clear to auscultation and percussion Abdomen: soft, nontender, normal bowel sounds   Lab Results: CBC Latest Ref Rng & Units 12/06/2016 12/05/2016 04/09/2013  WBC 3.6 - 11.0 K/uL 6.5 9.4 7.9  Hemoglobin 12.0 - 16.0 g/dL 7.8(L) 7.3(L) 13.6  Hematocrit 35.0 - 47.0 % 22.6(L) 22.1(L) 39.0  Platelets 150 - 440 K/uL 242 372 259    Micro Results: No results found for this or any previous visit (from the past 240 hour(s)). Studies/Results: No results found. Medications: I have reviewed the patient's current medications. Scheduled Meds: . lisinopril  20 mg Oral Daily   And  . hydrochlorothiazide  25 mg Oral Daily  . latanoprost  1 drop Both Eyes QHS  . pantoprazole (PROTONIX) IV  40 mg Intravenous Q12H   Continuous Infusions: . sodium chloride 75 mL/hr at 12/06/16 0900   PRN Meds:.acetaminophen **OR** acetaminophen, ALPRAZolam, ondansetron **OR** ondansetron (ZOFRAN) IV   Assessment: Active Problems:   GI bleed Brooke CheadleLula Mae Potts is a 83 y.o. y/o female admitted with symptomatic anemia ,  supratherapeutic inr due to coumadin for atrial fibrillation . History suggestive of an upper GI bleed.   Plan   1. IV PPI, clear liquid diet  2. H pylori stool antigen  3. Monitor CBC and transfuse as needed  4. Hold coumadin and when INR < 1.5 then will perform EGD   LOS: 1 day   Brooke Potts Charlise Potts 12/06/2016, 10:39 AM

## 2016-12-07 LAB — PREPARE RBC (CROSSMATCH)

## 2016-12-07 LAB — CBC
HCT: 19.1 % — ABNORMAL LOW (ref 35.0–47.0)
Hemoglobin: 6.7 g/dL — ABNORMAL LOW (ref 12.0–16.0)
MCH: 31.9 pg (ref 26.0–34.0)
MCHC: 34.9 g/dL (ref 32.0–36.0)
MCV: 91.4 fL (ref 80.0–100.0)
Platelets: 221 10*3/uL (ref 150–440)
RBC: 2.09 MIL/uL — ABNORMAL LOW (ref 3.80–5.20)
RDW: 13.7 % (ref 11.5–14.5)
WBC: 7.1 10*3/uL (ref 3.6–11.0)

## 2016-12-07 LAB — PROTIME-INR
INR: 2.88
Prothrombin Time: 30.8 seconds — ABNORMAL HIGH (ref 11.4–15.2)

## 2016-12-07 LAB — HEMOGLOBIN AND HEMATOCRIT, BLOOD
HCT: 26.6 % — ABNORMAL LOW (ref 35.0–47.0)
Hemoglobin: 8.9 g/dL — ABNORMAL LOW (ref 12.0–16.0)

## 2016-12-07 MED ORDER — SODIUM CHLORIDE 0.9 % IV SOLN
Freq: Once | INTRAVENOUS | Status: AC
Start: 2016-12-07 — End: 2016-12-07
  Administered 2016-12-07: 10:00:00 via INTRAVENOUS

## 2016-12-07 NOTE — Progress Notes (Signed)
[employer] - [place] at [facility]   PATIENT NAME: Brooke Potts    MR#:  [id number]  DATE OF BIRTH:  [birth year]  SUBJECTIVE:  CHIEF COMPLAINT:   Chief Complaint  Patient presents with  . Fatigue   Came fatigued for last one month, found to have dark stool and low Hb. INR high on coumadin. No more dark BM, tolerating diet. No urinary symptoms.  REVIEW OF SYSTEMS:  CONSTITUTIONAL: No fever,positive for fatigue or weakness.  EYES: No blurred or double vision.  EARS, NOSE, AND THROAT: No tinnitus or ear pain.  RESPIRATORY: No cough, shortness of breath, wheezing or hemoptysis.  CARDIOVASCULAR: No chest pain, orthopnea, edema.  GASTROINTESTINAL: No nausea, vomiting, diarrhea or abdominal pain.  GENITOURINARY: No dysuria, hematuria.  ENDOCRINE: No polyuria, nocturia,  HEMATOLOGY: No anemia, easy bruising or bleeding SKIN: No rash or lesion. MUSCULOSKELETAL: No joint pain or arthritis.   NEUROLOGIC: No tingling, numbness, weakness.  PSYCHIATRY: No anxiety or depression.   ROS  DRUG ALLERGIES:  No Known Allergies  VITALS:  Blood pressure (!) 121/55, pulse 86, temperature 97.9 F (36.6 C), temperature source Oral, resp. rate 18, height 5' (1.524 m), weight 51.2 kg (112 lb 14.4 oz), SpO2 100 %.  PHYSICAL EXAMINATION:  GENERAL:  83 y.o.-year-old patient lying in the bed with no acute distress.  EYES: Pupils equal, round, reactive to light and accommodation. No scleral icterus. Extraocular muscles intact. Conjunctiva pale. HEENT: Head atraumatic, normocephalic. Oropharynx and nasopharynx clear.  NECK:  Supple, no jugular venous distention. No thyroid enlargement, no tenderness.  LUNGS: Normal breath sounds bilaterally, no wheezing, rales,rhonchi or crepitation. No use of accessory muscles of respiration.  CARDIOVASCULAR: S1, S2 normal. No murmurs, rubs, or gallops.  ABDOMEN: Soft, nontender, nondistended. Bowel sounds present. No organomegaly or mass.   EXTREMITIES: No pedal edema, cyanosis, or clubbing.  NEUROLOGIC: Cranial nerves II through XII are intact. Muscle strength 4/5 in all extremities. Sensation intact. Gait not checked.  PSYCHIATRIC: The patient is alert and oriented x 3.  SKIN: No obvious rash, lesion, or ulcer.   Physical Exam LABORATORY PANEL:   CBC  Recent Labs Lab 12/07/16 0456 12/07/16 1331  WBC 7.1  --   HGB 6.7* 8.9*  HCT 19.1* 26.6*  PLT 221  --    ------------------------------------------------------------------------------------------------------------------  Chemistries   Recent Labs Lab 12/06/16 0501  NA 142  K 3.9  CL 113*  CO2 25  GLUCOSE 92  BUN 27*  CREATININE 1.11*  CALCIUM 8.2*   ------------------------------------------------------------------------------------------------------------------  Cardiac Enzymes No results for input(s): TROPONINI in the last 168 hours. ------------------------------------------------------------------------------------------------------------------  RADIOLOGY:  No results found.  ASSESSMENT AND PLAN:   Active Problems:   GI bleed  83 year old female with past medical history of hypertension, atrial fibrillation status post pacemaker me anxiety, glaucoma who presents to the hospital due to weakness and dizziness and noted to be anemic and also noted to have heme positive stools.  1. GI bleed-this is a suspected upper GI bleed given the patient's melanotic stools. -Given the patient symptomatically anemia , transfused 1 unit of packed red blood cells. Follow hemoglobin, stable. - on IV Protonix, clear liquid diet- ulgrade to soft diet. - as per GI- EGD once INR is < 1.5. Still high.  2. Anemia-this is acute blood loss anemia secondary to GI bleed.  - transfused 1 unit of packed red blood cells, will follow hemoglobin.  -hold Coumadin.  3. History of atrial fibrillation-patient is rate controlled and is status post pacemaker -  Hold Coumadin  given the GI bleed and anemia. - explained pt's daughter that due to GI bleed , we will stop coumadin from now onwards. She agreed.  4. Essential hypertension-continue a Lisinopril/HCTZ.  5. Glaucoma-continue latanoprost eyedrops  6. Anxiety-continue as needed Xanax.   All the records are reviewed and case discussed with Care Management/Social Workerr. Management plans discussed with the patient, family and they are in agreement.  CODE STATUS: Full.  TOTAL TIME TAKING CARE OF THIS PATIENT: 35 minutes.    POSSIBLE D/C IN 1-2 DAYS, DEPENDING ON CLINICAL CONDITION.   Altamese Dilling M.D on 12/07/2016   Between 7am to 6pm - Pager - [phone]  After 6pm go to [email or web address] - password [email or web address]  [facility] [place] [facility]  Office  [phone]  CC: Primary care physician; Rafael Bihari, MD  Note: This dictation was prepared with Dragon dictation along with smaller phrase technology. Any transcriptional errors that result from this process are unintentional.

## 2016-12-07 NOTE — Care Management Important Message (Signed)
Important Message  Patient Details  Name: Brooke Potts MRN: [id number] Date of Birth: [birth year]   Medicare Important Message Given:  Yes    Chapman FitchBOWEN, Weslee Prestage T, RN 12/07/2016, 1:49 PM

## 2016-12-07 NOTE — Progress Notes (Signed)
Reported Hgb- 6.7 to Dr. Sheryle Hailiamond. Dr. Sheryle Hailiamond to place orders.

## 2016-12-07 NOTE — Progress Notes (Signed)
Physical Therapy Treatment Patient Details Name: Brooke CheadleLula Mae Potts MRN: [id number] DOB: 08/12/1923 Today's Date: 12/07/2016    History of Present Illness Pt is a pleasant 83 yo female, has had increased weakness/fatigue with SOB and dizziness when walking up stairs the past few weeks [facility] w/ GI bleed and anemia. PMH includes CAD, HTN, A-fib, s/p pacemaker    PT Comments    Pt is able to ambulate with relatively good confidence though she did need regular cuing for safety awareness, to slow with more consistent cadence and she did need multiple standing rest breaks secondary to minimal fatigue. Overall pt did well with ambulation, too tired after the fact to do exercises.    Follow Up Recommendations  Home health PT     Equipment Recommendations   (4 wheeled walker)    Recommendations for Other Services       Precautions / Restrictions Precautions Precautions: Fall Restrictions Weight Bearing Restrictions: No    Mobility  Bed Mobility Overal bed mobility: Independent             General bed mobility comments: able to move from supine to sitting w/o difficulty   Transfers Overall transfer level: Modified independent Equipment used: Rolling walker (2 wheeled) Transfers: Sit to/from Stand Sit to Stand: Supervision         General transfer comment: Pt did well getting to standing w/o excessive UE assist  Ambulation/Gait Ambulation/Gait assistance: Supervision Ambulation Distance (Feet): 200 Feet Assistive device: Rolling walker (2 wheeled)       General Gait Details: Pt with very quick cadence and though she needed ~3 brief stand rest breaks, but overall had no LOBs or safety issues other than potentially trying to go too fast.    Stairs            Wheelchair Mobility    Modified Rankin (Stroke Patients Only)       Balance Overall balance assessment: Modified Independent   Sitting balance-Leahy Scale: Good       Standing  balance-Leahy Scale: Good                      Cognition Arousal/Alertness: Awake/alert Behavior During Therapy: WFL for tasks assessed/performed;Impulsive Overall Cognitive Status: Within Functional Limits for tasks assessed                      Exercises      General Comments        Pertinent Vitals/Pain Pain Assessment: No/denies pain    Home Living                      Prior Function            PT Goals (current goals can now be found in the care plan section) Progress towards PT goals: Progressing toward goals    Frequency    Min 2X/week      PT Plan Current plan remains appropriate    Co-evaluation             End of Session Equipment Utilized During Treatment: Gait belt Activity Tolerance: Patient tolerated treatment well Patient left: with call bell/phone within reach;with bed alarm set Nurse Communication: Mobility status PT Visit Diagnosis: Dizziness and giddiness (R42);Muscle weakness (generalized) (M62.81)     Time: [id number] PT Time Calculation (min) (ACUTE ONLY): 13 min  Charges:  $Gait Training: 8-22 mins  G Codes:       Malachi Pro, DPT 12/07/2016, 5:13 PM

## 2016-12-08 LAB — CBC
HEMATOCRIT: 24 % — AB (ref 35.0–47.0)
HEMOGLOBIN: 8.2 g/dL — AB (ref 12.0–16.0)
MCH: 30.3 pg (ref 26.0–34.0)
MCHC: 34.1 g/dL (ref 32.0–36.0)
MCV: 88.8 fL (ref 80.0–100.0)
Platelets: 212 10*3/uL (ref 150–440)
RBC: 2.7 MIL/uL — ABNORMAL LOW (ref 3.80–5.20)
RDW: 15.6 % — ABNORMAL HIGH (ref 11.5–14.5)
WBC: 7.8 10*3/uL (ref 3.6–11.0)

## 2016-12-08 LAB — TYPE AND SCREEN
ABO/RH(D): O POS
ANTIBODY SCREEN: NEGATIVE
Unit division: 0
Unit division: 0

## 2016-12-08 LAB — PROTIME-INR
INR: 1.77
Prothrombin Time: 20.8 seconds — ABNORMAL HIGH (ref 11.4–15.2)

## 2016-12-08 MED ORDER — SODIUM CHLORIDE 0.9 % IV SOLN
510.0000 mg | Freq: Once | INTRAVENOUS | Status: AC
  Administered 2016-12-08: 510 mg via INTRAVENOUS
  Filled 2016-12-08: qty 17

## 2016-12-08 NOTE — Progress Notes (Signed)
  Brooke Miniumarren Wohl, MD [facility]   [address] [place], [place] [address] Phone: [phone] Fax : [phone]   Subjective: No acute events overnight. Daughter bedside. NPO. Denies abdominal pain, melena or hematochezia. Feels weak. Daughter bedside   Objective: Vital signs in last 24 hours: Vitals:   12/07/16 1035 12/07/16 1308 12/08/16 0703 12/08/16 0752  BP: (!) 123/51 (!) 121/55 (!) 141/55 (!) 124/53  Pulse: 88 86 78 80  Resp: 17 18 18 20   Temp: 97.9 F (36.6 C) 97.9 F (36.6 C) 97.6 F (36.4 C) 97.7 F (36.5 C)  TempSrc: Oral Oral Oral Oral  SpO2: 100% 100% 98% 99%  Weight:      Height:       Weight change:   Intake/Output Summary (Last 24 hours) at 12/08/16 0846 Last data filed at 12/08/16 [id number]  Gross per 24 hour  Intake          2584.25 ml  Output             1950 ml  Net           634.25 ml     Exam: Heart:: Regular rate and rhythm Lungs: clear to auscultation Abdomen: soft, nontender, normal bowel sounds   Lab Results: @LABTEST2 @ Micro Results: No results found for this or any previous visit (from the past 240 hour(s)). Studies/Results: No results found. Medications: I have reviewed the patient's current medications. Scheduled Meds: . lisinopril  20 mg Oral Daily   And  . hydrochlorothiazide  25 mg Oral Daily  . latanoprost  1 drop Both Eyes QHS  . pantoprazole (PROTONIX) IV  40 mg Intravenous Q12H   Continuous Infusions: PRN Meds:.acetaminophen **OR** acetaminophen, ALPRAZolam, ondansetron **OR** ondansetron (ZOFRAN) IV   Assessment: Active Problems:   GI bleed Severe symptomatic anemia INR 1.77 today. Hb dropped to 8.2 from 8.9 likely equilibrating.  Plan: - Soft diet today - NPO past mid night - EGD tomorrow - Feraheme x 1 - Oral iron TID as out pt for 3months    LOS: 3 days   Brooke Potts 12/08/2016, 8:46 AM

## 2016-12-08 NOTE — Progress Notes (Signed)
[employer] [facility] - [place] at [facility]   PATIENT NAME: Rogelio SeenLula Emily    MR#:  [id number]  DATE OF BIRTH:  29-Sep-1923  SUBJECTIVE:  CHIEF COMPLAINT:   Chief Complaint  Patient presents with  . Fatigue   Some nausea today No abd pain Stools less dark  REVIEW OF SYSTEMS:  CONSTITUTIONAL: No fever,positive for fatigue or weakness.  EYES: No blurred or double vision.  EARS, NOSE, AND THROAT: No tinnitus or ear pain.  RESPIRATORY: No cough, shortness of breath, wheezing or hemoptysis.  CARDIOVASCULAR: No chest pain, orthopnea, edema.  GASTROINTESTINAL: No nausea, vomiting, diarrhea or abdominal pain.  GENITOURINARY: No dysuria, hematuria.  ENDOCRINE: No polyuria, nocturia,  HEMATOLOGY: No anemia, easy bruising or bleeding SKIN: No rash or lesion. MUSCULOSKELETAL: No joint pain or arthritis.   NEUROLOGIC: No tingling, numbness, weakness.  PSYCHIATRY: No anxiety or depression.   ROS  DRUG ALLERGIES:  No Known Allergies  VITALS:  Blood pressure 134/62, pulse 80, temperature 97.7 F (36.5 C), temperature source Oral, resp. rate 20, height 5' (1.524 m), weight 51.2 kg (112 lb 14.4 oz), SpO2 99 %.  PHYSICAL EXAMINATION:  GENERAL:  83 y.o.-year-old patient lying in the bed with no acute distress.  EYES: Pupils equal, round, reactive to light and accommodation. No scleral icterus. Extraocular muscles intact. Conjunctiva pale. HEENT: Head atraumatic, normocephalic. Oropharynx and nasopharynx clear.  NECK:  Supple, no jugular venous distention. No thyroid enlargement, no tenderness.  LUNGS: Normal breath sounds bilaterally, no wheezing, rales,rhonchi or crepitation. No use of accessory muscles of respiration.  CARDIOVASCULAR: S1, S2 normal. No murmurs, rubs, or gallops.  ABDOMEN: Soft, nontender, nondistended. Bowel sounds present. No organomegaly or mass.  EXTREMITIES: No pedal edema, cyanosis, or clubbing.  NEUROLOGIC: Cranial nerves II through XII are intact. Muscle  strength 4/5 in all extremities. Sensation intact. Gait not checked.  PSYCHIATRIC: The patient is alert and oriented x 3.  SKIN: No obvious rash, lesion, or ulcer.   Physical Exam LABORATORY PANEL:   CBC  Recent Labs Lab 12/08/16 0356  WBC 7.8  HGB 8.2*  HCT 24.0*  PLT 212   ------------------------------------------------------------------------------------------------------------------  Chemistries   Recent Labs Lab 12/06/16 0501  NA 142  K 3.9  CL 113*  CO2 25  GLUCOSE 92  BUN 27*  CREATININE 1.11*  CALCIUM 8.2*   ------------------------------------------------------------------------------------------------------------------  Cardiac Enzymes No results for input(s): TROPONINI in the last 168 hours. ------------------------------------------------------------------------------------------------------------------  RADIOLOGY:  No results found.  ASSESSMENT AND PLAN:   Active Problems:   GI bleed  83 year old female with past medical history of hypertension, atrial fibrillation status post pacemaker me anxiety, glaucoma who presents to the hospital due to weakness and dizziness and noted to be anemic and also noted to have heme positive stools.  1. GI bleed-this is a suspected upper GI bleed given the patient's melanotic stools. With acute blood loss anemia - on IV Protonix, clear liquid diet - as per GI- EGD once INR is < 1.5.  EGD tomorrow Discussed with Dr. Allegra LaiVanga  2. Acute blood loss Anemia - transfused 1 unit of packed RBC - Hb improved  3. History of atrial fibrillation-patient is rate controlled and is status post pacemaker -Hold Coumadin given the GI bleed and anemia. - Risk outweighs benefit and patient needs to be off coumadin. Discussed with patient and daughter  814. Essential hypertension-continue a Lisinopril/HCTZ.  5. Glaucoma-continue latanoprost eyedrops  6. Anxiety-continue as needed Xanax.  All the records are reviewed and  case discussed with  Care Management/Social Worker Management plans discussed with the patient, family and they are in agreement.  CODE STATUS: Full.  TOTAL TIME TAKING CARE OF THIS PATIENT: 35 minutes.   POSSIBLE D/C IN 1-2 DAYS, DEPENDING ON CLINICAL CONDITION.  Milagros Loll R M.D on 12/08/2016   Between 7am to 6pm - Pager - [phone]  After 6pm go to [email or web address] - password [email or web address]  [facility]  Office  [phone]  CC: Primary care physician; Rafael Bihari, MD  Note: This dictation was prepared with Dragon dictation along with smaller phrase technology. Any transcriptional errors that result from this process are unintentional.

## 2016-12-08 NOTE — Progress Notes (Signed)
RN was present during and 30 minutes after the administration of IVPB Feraheme. Pt showed no signs of reaction. Will continue to monitor pt closely.  Nerida Boivin [name]

## 2016-12-09 ENCOUNTER — Encounter
Admission: EM | Disposition: A | Discharge status: Home / Self Care | Payer: Self-pay | Source: Home / Self Care | Attending: Internal Medicine

## 2016-12-09 ENCOUNTER — Inpatient Hospital Stay: Payer: Medicare Other | Admitting: Anesthesiology

## 2016-12-09 HISTORY — PX: ESOPHAGOGASTRODUODENOSCOPY: SHX5428

## 2016-12-09 LAB — IRON AND TIBC
IRON: 483 ug/dL — AB (ref 28–170)
TIBC: 342 ug/dL (ref 250–450)

## 2016-12-09 LAB — PROTIME-INR
INR: 1.26
Prothrombin Time: 15.9 seconds — ABNORMAL HIGH (ref 11.4–15.2)

## 2016-12-09 LAB — HEMOGLOBIN: Hemoglobin: 7.8 g/dL — ABNORMAL LOW (ref 12.0–16.0)

## 2016-12-09 LAB — FERRITIN: FERRITIN: 80 ng/mL (ref 11–307)

## 2016-12-09 SURGERY — EGD (ESOPHAGOGASTRODUODENOSCOPY)
Anesthesia: General

## 2016-12-09 MED ORDER — SODIUM CHLORIDE 0.9 % IV SOLN
INTRAVENOUS | Status: DC
  Administered 2016-12-10: 06:00:00 via INTRAVENOUS

## 2016-12-09 MED ORDER — PROPOFOL 10 MG/ML IV BOLUS
INTRAVENOUS | Status: DC | PRN
Start: 2016-12-09 — End: 2016-12-09
  Administered 2016-12-09: 40 mg via INTRAVENOUS
  Administered 2016-12-09 (×2): 20 mg via INTRAVENOUS
  Administered 2016-12-09: 30 mg via INTRAVENOUS

## 2016-12-09 MED ORDER — EPHEDRINE SULFATE 50 MG/ML IJ SOLN
INTRAMUSCULAR | Status: AC
  Filled 2016-12-09: qty 1

## 2016-12-09 MED ORDER — SODIUM CHLORIDE 0.9 % IJ SOLN
INTRAMUSCULAR | Status: AC
  Filled 2016-12-09: qty 10

## 2016-12-09 MED ORDER — PROPOFOL 10 MG/ML IV BOLUS
INTRAVENOUS | Status: AC
  Filled 2016-12-09: qty 20

## 2016-12-09 MED ORDER — SODIUM CHLORIDE 0.9 % IV SOLN
INTRAVENOUS | Status: DC | PRN
  Administered 2016-12-09: 10:00:00 via INTRAVENOUS

## 2016-12-09 MED ORDER — EPHEDRINE SULFATE-NACL 50-0.9 MG/10ML-% IV SOSY
PREFILLED_SYRINGE | INTRAVENOUS | Status: DC | PRN
  Administered 2016-12-09: 10 mg via INTRAVENOUS

## 2016-12-09 NOTE — Transfer of Care (Signed)
Immediate Anesthesia Transfer of Care Note  Patient: Delman CheadleLula Mae Brekke  Procedure(s) Performed: Procedure(s): ESOPHAGOGASTRODUODENOSCOPY (EGD) (N/A)  Patient Location: PACU and Endoscopy Unit  Anesthesia Type:General  Level of Consciousness: patient cooperative and lethargic  Airway & Oxygen Therapy: Patient Spontanous Breathing and Patient connected to nasal cannula oxygen  Post-op Assessment: Report given to RN and Post -op Vital signs reviewed and stable  Post vital signs: Reviewed and stable  Last Vitals:  Vitals:   12/09/16 1000 12/09/16 1010  BP: (!) 79/23 (!) 97/50  Pulse: 80 (!) 105  Resp: 16 14  Temp: 36.3 C 36.3 C    Last Pain:  Vitals:   12/09/16 1000  TempSrc: Tympanic  PainSc:          Complications: No apparent anesthesia complications

## 2016-12-09 NOTE — Progress Notes (Signed)
[employer] - [place] at [facility]   PATIENT NAME: Brooke SeenLula Potts    MR#:  [id number]  DATE OF BIRTH:  [birth year]  SUBJECTIVE:  CHIEF COMPLAINT:   Chief Complaint  Patient presents with  . Fatigue   Stool less dark. No blood. No abd pain EGD was normal  REVIEW OF SYSTEMS:  CONSTITUTIONAL: No fever,positive for fatigue or weakness.  EYES: No blurred or double vision.  EARS, NOSE, AND THROAT: No tinnitus or ear pain.  RESPIRATORY: No cough, shortness of breath, wheezing or hemoptysis.  CARDIOVASCULAR: No chest pain, orthopnea, edema.  GASTROINTESTINAL: No nausea, vomiting, diarrhea or abdominal pain.  GENITOURINARY: No dysuria, hematuria.  ENDOCRINE: No polyuria, nocturia,  HEMATOLOGY: No anemia, easy bruising or bleeding SKIN: No rash or lesion. MUSCULOSKELETAL: No joint pain or arthritis.   NEUROLOGIC: No tingling, numbness, weakness.  PSYCHIATRY: No anxiety or depression.   ROS  DRUG ALLERGIES:  No Known Allergies  VITALS:  Blood pressure (!) 143/55, pulse 84, temperature 97.2 F (36.2 C), temperature source Oral, resp. rate 18, height 5' (1.524 m), weight 51.2 kg (112 lb 14.4 oz), SpO2 100 %.  PHYSICAL EXAMINATION:  GENERAL:  83 y.o.-year-old patient lying in the bed with no acute distress.  EYES: Pupils equal, round, reactive to light and accommodation. No scleral icterus. Extraocular muscles intact. Conjunctiva pale. HEENT: Head atraumatic, normocephalic. Oropharynx and nasopharynx clear.  NECK:  Supple, no jugular venous distention. No thyroid enlargement, no tenderness.  LUNGS: Normal breath sounds bilaterally, no wheezing, rales,rhonchi or crepitation. No use of accessory muscles of respiration.  CARDIOVASCULAR: S1, S2 normal. No murmurs, rubs, or gallops.  ABDOMEN: Soft, nontender, nondistended. Bowel sounds present. No organomegaly or mass.  EXTREMITIES: No pedal edema, cyanosis, or clubbing.  NEUROLOGIC: Cranial nerves II through XII are  intact. Muscle strength 4/5 in all extremities. Sensation intact. Gait not checked.  PSYCHIATRIC: The patient is alert and oriented x 3.  SKIN: No obvious rash, lesion, or ulcer.   Physical Exam LABORATORY PANEL:   CBC  Recent Labs Lab 12/08/16 0356 12/09/16 0414  WBC 7.8  --   HGB 8.2* 7.8*  HCT 24.0*  --   PLT 212  --    ------------------------------------------------------------------------------------------------------------------  Chemistries   Recent Labs Lab 12/06/16 0501  NA 142  K 3.9  CL 113*  CO2 25  GLUCOSE 92  BUN 27*  CREATININE 1.11*  CALCIUM 8.2*   ------------------------------------------------------------------------------------------------------------------  Cardiac Enzymes No results for input(s): TROPONINI in the last 168 hours. ------------------------------------------------------------------------------------------------------------------  RADIOLOGY:  No results found.  ASSESSMENT AND PLAN:   Active Problems:   GI bleed  83 year old female with past medical history of hypertension, atrial fibrillation status post pacemaker me anxiety, glaucoma who presents to the hospital due to weakness and dizziness and noted to be anemic and also noted to have heme positive stools.  1. GI bleed-this is a suspected upper GI bleed given the patient's melanotic stools. With acute blood loss anemia EGD  Is normal. Colonoscopy tomorrow Discussed with Dr. Allegra LaiVanga Full liquid diet for now. NPO after midnight  2. Acute blood loss Anemia - transfused 1 unit of packed RBC - Hb improved  3. History of atrial fibrillation-patient is rate controlled and is status post pacemaker -Hold Coumadin given the GI bleed and anemia. - Risk outweighs benefit and patient needs to be off coumadin. Discussed with patient and daughter  754. Essential hypertension-continue Lisinopril/HCTZ.  5. Glaucoma-continue latanoprost eyedrops  6. Anxiety-continue as needed  Xanax.  All  the records are reviewed and case discussed with Care Management/Social Worker Management plans discussed with the patient, family and they are in agreement.  CODE STATUS: Full.  TOTAL TIME TAKING CARE OF THIS PATIENT: 35 minutes.   POSSIBLE D/C IN 1-2 DAYS, DEPENDING ON CLINICAL CONDITION.  Milagros Loll R M.D on 12/09/2016   Between 7am to 6pm - Pager - [phone]  After 6pm go to [email or web address] - password [email or web address]  [facility]  Office  [phone]  CC: Primary care physician; Rafael Bihari, MD  Note: This dictation was prepared with Dragon dictation along with smaller phrase technology. Any transcriptional errors that result from this process are unintentional.

## 2016-12-09 NOTE — Progress Notes (Signed)
Patient to have colonoscopy tomorrow, will be on clear liquids today and NPO after midnight and IV to run at 3520ml's.Patient daughter aware of the colonoscopy tomorrow.

## 2016-12-09 NOTE — Anesthesia Postprocedure Evaluation (Signed)
Anesthesia Post Note  Patient: Brooke CheadleLula Mae Potts  Procedure(s) Performed: Procedure(s) (LRB): ESOPHAGOGASTRODUODENOSCOPY (EGD) (N/A)  Patient location during evaluation: Endoscopy Anesthesia Type: General Level of consciousness: awake and alert Pain management: pain level controlled Vital Signs Assessment: post-procedure vital signs reviewed and stable Respiratory status: spontaneous breathing and respiratory function stable Cardiovascular status: stable Anesthetic complications: no     Last Vitals:  Vitals:   12/09/16 1030 12/09/16 1050  BP: 115/62 (!) 143/55  Pulse: 94 84  Resp: 14 18  Temp:  36.2 C    Last Pain:  Vitals:   12/09/16 1050  TempSrc: Oral  PainSc:                  KEPHART,WILLIAM K

## 2016-12-09 NOTE — Progress Notes (Signed)
Brief GI note  Pt seen in endoscopy suite. No acute events overnight. EGD no source of bleeding identified Discuss with family about colonoscopy Check B12, ferritin, folate levels  Brooke Repressohini R Vanga, MD

## 2016-12-09 NOTE — Op Note (Signed)
[facility] Patient Name: Brooke Potts Procedure Date: 12/09/2016 9:38 AM MRN: [id number] Account #: [id number] Date of Birth: [birth year] Admit Type: Inpatient Age: 83 Room: [facility] ENDO ROOM 1 Gender: Female Note Status: Finalized Procedure:            Upper GI endoscopy Indications:          Melena, normocytic anemia Providers:            Lin Landsman MD, MD Referring MD:         Hewitt Blade. Sarina Ser, MD (Referring MD) Complications:        No immediate complications. Estimated blood loss:                        Minimal. Procedure:            Pre-Anesthesia Assessment:                       - Prior to the procedure, a History and Physical was                        performed, and patient medications and allergies were                        reviewed. The patient is competent. The risks and                        benefits of the procedure and the sedation options and                        risks were discussed with the patient. All questions                        were answered and informed consent was obtained.                        Patient identification and proposed procedure were                        verified by the physician, the nurse, the                        anesthesiologist, the anesthetist and the technician in                        the pre-procedure area in the procedure room. Mental                        Status Examination: alert and oriented. Airway                        Examination: normal oropharyngeal airway and neck                        mobility. Respiratory Examination: clear to                        auscultation. CV Examination: normal. Prophylactic                        Antibiotics: The patient does not  require prophylactic                        antibiotics. Prior Anticoagulants: The patient has                        taken no previous anticoagulant or antiplatelet agents.                        ASA Grade  Assessment: III - A patient with severe                        systemic disease. After reviewing the risks and                        benefits, the patient was deemed in satisfactory                        condition to undergo the procedure. The anesthesia plan                        was to use monitored anesthesia care (MAC). Immediately                        prior to administration of medications, the patient was                        re-assessed for adequacy to receive sedatives. The                        heart rate, respiratory rate, oxygen saturations, blood                        pressure, adequacy of pulmonary ventilation, and                        response to care were monitored throughout the                        procedure. The physical status of the patient was                        re-assessed after the procedure.                       After obtaining informed consent, the endoscope was                        passed under direct vision. Throughout the procedure,                        the patient's blood pressure, pulse, and oxygen                        saturations were monitored continuously. The Endoscope                        was introduced through the mouth, and advanced to the                        fourth part of  duodenum. The upper GI endoscopy was                        accomplished without difficulty. The patient tolerated                        the procedure well. Findings:      The first portion of the duodenum, second portion of the duodenum, third       portion of the duodenum and fourth portion of the duodenum were normal.      A single 9 mm submucosal nodule with no bleeding and no stigmata of       recent bleeding was found on the greater curvature of the proximal       gastric body. Biopsies were taken with a cold forceps for histology. To       prevent bleeding after the biopsy, one hemostatic clip was successfully       placed. There was no bleeding  at the end of the procedure.      The cardia, gastric fundus, gastric body, gastric antrum and pylorus       were normal.      The gastroesophageal junction and examined esophagus were normal. Impression:           - Normal first portion of the duodenum, second portion                        of the duodenum, third portion of the duodenum and                        fourth portion of the duodenum.                       - A single submucosal papule (nodule) found in the                        stomach. Biopsied. Clip was placed.                       - Normal cardia, gastric fundus, gastric body, antrum                        and pylorus.                       - Normal gastroesophageal junction and esophagus.                       - No source upper GI source of melena identified Recommendation:       - Return patient to hospital ward for ongoing care.                       - Resume previous diet and advance diet as tolerated                        today.                       - Continue present medications.                       - Await pathology results.                       -  Complete anemia work up, check Ferritin, B12, iron                        levels, folic acid                       - Defer to primary MD regarding restarting                        anticoagulation Procedure Code(s):    --- Professional ---                       [id number], Esophagogastroduodenoscopy, flexible, transoral;                        with biopsy, single or multiple CPT copyright 2016 American Medical Association. All rights reserved. The codes documented in this report are preliminary and upon coder review may  be revised to meet current compliance requirements. Dr. Ulyess Mort Lin Landsman MD, MD 12/09/2016 10:14:04 AM This report has been signed electronically. Number of Addenda: 0 Note Initiated On: 12/09/2016 9:38 AM      [facility]

## 2016-12-09 NOTE — Anesthesia Post-op Follow-up Note (Cosign Needed)
Anesthesia QCDR form completed.        

## 2016-12-09 NOTE — Anesthesia Preprocedure Evaluation (Signed)
Anesthesia Evaluation   Patient awake    Reviewed: Allergy & Precautions, NPO status , Patient's Chart, lab work & pertinent test results  History of Anesthesia Complications Negative for: history of anesthetic complications  Airway Mallampati: III       Dental   Pulmonary neg pulmonary ROS,           Cardiovascular hypertension, Pt. on medications + CAD  + dysrhythmias Atrial Fibrillation + pacemaker      Neuro/Psych TIA (Facial droop, no residual)   GI/Hepatic Neg liver ROS, GERD  ,  Endo/Other  negative endocrine ROS  Renal/GU negative Renal ROS     Musculoskeletal   Abdominal   Peds  Hematology negative hematology ROS (+)   Anesthesia Other Findings   Reproductive/Obstetrics                             Anesthesia Physical Anesthesia Plan  ASA: III and emergent  Anesthesia Plan: General   Post-op Pain Management:    Induction: Intravenous  Airway Management Planned: Nasal Cannula  Additional Equipment:   Intra-op Plan:   Post-operative Plan:   Informed Consent: I have reviewed the patients History and Physical, chart, labs and discussed the procedure including the risks, benefits and alternatives for the proposed anesthesia with the patient or authorized representative who has indicated his/her understanding and acceptance.     Plan Discussed with:   Anesthesia Plan Comments:         Anesthesia Quick Evaluation

## 2016-12-10 ENCOUNTER — Encounter: Payer: Self-pay | Admitting: Gastroenterology

## 2016-12-10 ENCOUNTER — Encounter
Admission: EM | Disposition: A | Discharge status: Home / Self Care | Payer: Self-pay | Source: Home / Self Care | Attending: Internal Medicine

## 2016-12-10 LAB — H. PYLORI ANTIGEN, STOOL: H. Pylori Stool Ag, Eia: NEGATIVE

## 2016-12-10 LAB — HEMOGLOBIN: HEMOGLOBIN: 7.8 g/dL — AB (ref 12.0–16.0)

## 2016-12-10 LAB — GLUCOSE, CAPILLARY: GLUCOSE-CAPILLARY: 88 mg/dL (ref 65–99)

## 2016-12-10 LAB — VITAMIN B12: Vitamin B-12: 267 pg/mL (ref 180–914)

## 2016-12-10 SURGERY — COLONOSCOPY WITH PROPOFOL
Anesthesia: General

## 2016-12-10 MED ORDER — PEG 3350-KCL-NA BICARB-NACL 420 G PO SOLR
4000.0000 mL | Freq: Once | ORAL | Status: DC
  Filled 2016-12-10: qty 4000

## 2016-12-10 MED ORDER — VITAMIN B-12 1000 MCG PO TABS
1000.0000 ug | ORAL_TABLET | Freq: Every day | ORAL | Status: DC
  Administered 2016-12-10: 1000 ug via ORAL
  Filled 2016-12-10: qty 1

## 2016-12-10 MED ORDER — POLYETHYLENE GLYCOL 3350 17 GM/SCOOP PO POWD
1.0000 | Freq: Once | ORAL | Status: AC
  Administered 2016-12-10: 255 g via ORAL
  Filled 2016-12-10: qty 255

## 2016-12-10 MED ORDER — BISACODYL 5 MG PO TBEC
10.0000 mg | DELAYED_RELEASE_TABLET | Freq: Once | ORAL | Status: AC
  Administered 2016-12-10: 10 mg via ORAL
  Filled 2016-12-10: qty 2

## 2016-12-10 NOTE — Progress Notes (Signed)
She did not receive prep for colonoscopy today .   Plan   1. Prep today  2. Colonoscopy tomorrow.  Dr Wyline MoodKiran Layali Freund  Gastroenterology/Hepatology Pager: [phone]

## 2016-12-10 NOTE — Progress Notes (Signed)
[employer] - [place] at [facility]   PATIENT NAME: Brooke Potts    MR#:  [id number]  DATE OF BIRTH:  [birth year]  SUBJECTIVE:  CHIEF COMPLAINT:   Chief Complaint  Patient presents with  . Fatigue   Stool less dark. No blood. No abd pain EGD was normal.  Unable to get colonoscopy today  REVIEW OF SYSTEMS:  CONSTITUTIONAL: No fever,positive for fatigue or weakness.  EYES: No blurred or double vision.  EARS, NOSE, AND THROAT: No tinnitus or ear pain.  RESPIRATORY: No cough, shortness of breath, wheezing or hemoptysis.  CARDIOVASCULAR: No chest pain, orthopnea, edema.  GASTROINTESTINAL: No nausea, vomiting, diarrhea or abdominal pain.  GENITOURINARY: No dysuria, hematuria.  ENDOCRINE: No polyuria, nocturia,  HEMATOLOGY: No anemia, easy bruising or bleeding SKIN: No rash or lesion. MUSCULOSKELETAL: No joint pain or arthritis.   NEUROLOGIC: No tingling, numbness, weakness.  PSYCHIATRY: No anxiety or depression.   ROS  DRUG ALLERGIES:  No Known Allergies  VITALS:  Blood pressure (!) 139/59, pulse 77, temperature 97.9 F (36.6 C), temperature source Oral, resp. rate 16, height 5' (1.524 m), weight 51.2 kg (112 lb 14.4 oz), SpO2 98 %.  PHYSICAL EXAMINATION:  GENERAL:  83 y.o.-year-old patient lying in the bed with no acute distress.  EYES: Pupils equal, round, reactive to light and accommodation. No scleral icterus. Extraocular muscles intact. Conjunctiva pale. HEENT: Head atraumatic, normocephalic. Oropharynx and nasopharynx clear.  NECK:  Supple, no jugular venous distention. No thyroid enlargement, no tenderness.  LUNGS: Normal breath sounds bilaterally, no wheezing, rales,rhonchi or crepitation. No use of accessory muscles of respiration.  CARDIOVASCULAR: S1, S2 normal. No murmurs, rubs, or gallops.  ABDOMEN: Soft, nontender, nondistended. Bowel sounds present. No organomegaly or mass.  EXTREMITIES: No pedal edema, cyanosis, or clubbing.  NEUROLOGIC:  Cranial nerves II through XII are intact. Muscle strength 4/5 in all extremities. Sensation intact. Gait not checked.  PSYCHIATRIC: The patient is alert and oriented x 3.  SKIN: No obvious rash, lesion, or ulcer.   Physical Exam LABORATORY PANEL:   CBC  Recent Labs Lab 12/08/16 0356  12/10/16 0342  WBC 7.8  --   --   HGB 8.2*  < > 7.8*  HCT 24.0*  --   --   PLT 212  --   --   < > = values in this interval not displayed. ------------------------------------------------------------------------------------------------------------------  Chemistries   Recent Labs Lab 12/06/16 0501  NA 142  K 3.9  CL 113*  CO2 25  GLUCOSE 92  BUN 27*  CREATININE 1.11*  CALCIUM 8.2*   ------------------------------------------------------------------------------------------------------------------  Cardiac Enzymes No results for input(s): TROPONINI in the last 168 hours. ------------------------------------------------------------------------------------------------------------------  RADIOLOGY:  No results found.  ASSESSMENT AND PLAN:   Active Problems:   GI bleed  83 year old female with past medical history of hypertension, atrial fibrillation status post pacemaker me anxiety, glaucoma who presents to the hospital due to weakness and dizziness and noted to be anemic and also noted to have heme positive stools.  1. GI bleed-this is a suspected upper GI bleed given the patient's melanotic stools. With acute blood loss anemia EGD  Is normal. Colonoscopy could not be done today due to no prep liquid diet for now. NPO after midnight. Doesn't want to drink golytely. Added dulcolax and Miralax  2. Acute blood loss Anemia - transfused 1 unit of packed RBC - Hb improved and stable  3. History of atrial fibrillation-patient is rate controlled and is status post pacemaker. - Hold  Coumadin given the GI bleed and anemia. - Risk outweighs benefit and patient needs to be off coumadin.  Discussed with patient and daughter.  4. Essential hypertension-continue Lisinopril/HCTZ.  5. Glaucoma-continue latanoprost eyedrops.  6. Anxiety-continue as needed Xanax.  All the records are reviewed and case discussed with Care Management/Social Worker Management plans discussed with the patient, family and they are in agreement.  CODE STATUS: Full.  TOTAL TIME TAKING CARE OF THIS PATIENT: 35 minutes.   POSSIBLE D/C IN 1-2 DAYS, DEPENDING ON CLINICAL CONDITION.  Milagros Loll R M.D on 12/10/2016   Between 7am to 6pm - Pager - [phone]  After 6pm go to [email or web address] - password [email or web address]  [facility] [place] [facility]  Office  [phone]  CC: Primary care physician; Rafael Bihari, MD  Note: This dictation was prepared with Dragon dictation along with smaller phrase technology. Any transcriptional errors that result from this process are unintentional.

## 2016-12-10 NOTE — Care Management (Signed)
Late Entry:  Met with patient Friday 12/07/16 for assessment.  Lives at home with daughter.  PCP [facility].  Pharmacy [facility].  Has shower chair in the home.  PT has assessed patient and recommended home health PT and rollator. Patient was offered home health agency preference.   She stated that she did not have a preference of agency. Heads up referral made to [facility] with [place].  Rollator delivered to room.  RNCM following.

## 2016-12-10 NOTE — Progress Notes (Signed)
Bowel prep has started.   Brooke Potts [name]

## 2016-12-10 NOTE — Progress Notes (Signed)
PT Cancellation Note  Patient Details Name: Delman CheadleLula Mae Mulhall MRN: [id number] DOB: 08-Feb-1923   Cancelled Treatment:    Reason Eval/Treat Not Completed: Other (comment) (chart reviewed, nursing consulted stated patient just started bowel prep this afternoon for colonoscopy tomorrow (12/11/16) and to consult with the patient. Patient stated she did not want to participate at this time, will reattempt when appropriate )   Riccardo DubinQuinton Mansur Patti Student PT 12/10/2016, 4:21 PM

## 2016-12-10 NOTE — Progress Notes (Signed)
Pt has completed bowel prep.   Brooke Potts [name]

## 2016-12-10 NOTE — Care Management Important Message (Signed)
Important Message  Patient Details  Name: Brooke Potts MRN: [id number] Date of Birth: 1923-07-11   Medicare Important Message Given:  Yes    Chapman FitchBOWEN, Laylana Gerwig T, RN 12/10/2016, 2:38 PM

## 2016-12-11 ENCOUNTER — Encounter
Admission: EM | Disposition: A | Discharge status: Home / Self Care | Payer: Self-pay | Source: Home / Self Care | Attending: Internal Medicine

## 2016-12-11 ENCOUNTER — Inpatient Hospital Stay: Payer: Medicare Other | Admitting: Anesthesiology

## 2016-12-11 HISTORY — PX: COLONOSCOPY WITH PROPOFOL: SHX5780

## 2016-12-11 LAB — SURGICAL PATHOLOGY

## 2016-12-11 SURGERY — COLONOSCOPY WITH PROPOFOL
Anesthesia: General

## 2016-12-11 MED ORDER — FERROUS SULFATE 325 (65 FE) MG PO TABS: 325.0000 mg | ORAL_TABLET | Freq: Two times a day (BID) | ORAL | 0 refills | Status: DC

## 2016-12-11 MED ORDER — LIDOCAINE HCL (PF) 2 % IJ SOLN
INTRAMUSCULAR | Status: AC
  Filled 2016-12-11: qty 2

## 2016-12-11 MED ORDER — CYANOCOBALAMIN 1000 MCG PO TABS: 1000.0000 ug | ORAL_TABLET | Freq: Every day | ORAL | 0 refills | Status: DC

## 2016-12-11 MED ORDER — LIDOCAINE 2% (20 MG/ML) 5 ML SYRINGE
INTRAMUSCULAR | Status: DC | PRN
Start: 2016-12-11 — End: 2016-12-11
  Administered 2016-12-11: 60 mg via INTRAVENOUS

## 2016-12-11 MED ORDER — PROPOFOL 10 MG/ML IV BOLUS
INTRAVENOUS | Status: DC | PRN
  Administered 2016-12-11: 50 mg via INTRAVENOUS

## 2016-12-11 MED ORDER — PROPOFOL 500 MG/50ML IV EMUL
INTRAVENOUS | Status: DC | PRN
  Administered 2016-12-11: 140 ug/kg/min via INTRAVENOUS

## 2016-12-11 MED ORDER — ASPIRIN EC 81 MG PO TBEC: 81.0000 mg | DELAYED_RELEASE_TABLET | Freq: Every day | ORAL | Status: DC

## 2016-12-11 NOTE — Discharge Instructions (Signed)
Resume diet and activity as before ° ° °

## 2016-12-11 NOTE — H&P (Signed)
  Wyline MoodKiran Carmel Garfield MD [address] [place], [place] [address] Phone: [phone] Fax : [phone]  Primary Care Physician:  Rafael BihariWALKER III, JOHN B, MD Primary Gastroenterologist:  Dr. Wyline MoodKiran Malikah Principato   Pre-Procedure History & Physical: HPI:  Brooke Potts is a 83 y.o. female is here for an colonoscopy.   Past Medical History:  Diagnosis Date  . Coronary artery disease   . Dysrhythmia   . Hypertension   . Pacemaker     Past Surgical History:  Procedure Laterality Date  . ESOPHAGOGASTRODUODENOSCOPY N/A 12/09/2016   Procedure: ESOPHAGOGASTRODUODENOSCOPY (EGD);  Surgeon: Toney Reilohini Reddy Vanga, MD;  [facility];  Service: Gastroenterology;  Laterality: N/A;  . PACEMAKER INSERTION      Prior to Admission medications   Medication Sig Start Date End Date Taking? Authorizing Provider  acetaminophen (TYLENOL) 325 MG tablet Take 650 mg by mouth every 6 (six) hours as needed.   Yes Historical Provider, MD  ALPRAZolam (XANAX) 0.25 MG tablet Take 0.25 mg by mouth 3 (three) times daily as needed. 10/13/16  Yes Historical Provider, MD  Cranberry 450 MG TABS Take 450 mg by mouth 2 (two) times daily.   Yes Historical Provider, MD  latanoprost (XALATAN) 0.005 % ophthalmic solution Place 1 drop into both eyes at bedtime. 09/19/14  Yes Historical Provider, MD  lisinopril-hydrochlorothiazide (PRINZIDE,ZESTORETIC) 20-25 MG tablet Take 1 tablet by mouth daily. 11/22/16  Yes Historical Provider, MD  warfarin (COUMADIN) 2 MG tablet Take 2 mg by mouth daily. 10/16/16  Yes Historical Provider, MD    Allergies as of 12/05/2016  . (No Known Allergies)    Family History  Problem Relation Age of Onset  . Breast cancer Mother [id number]  . Peripheral vascular disease Mother   . Diabetes Mother   . Stroke Father   . Diabetes Father   . Diabetes Sister   . Diabetes Brother     Social History   Social History  . Marital status: Divorced    Spouse name: N/A  . Number of children: N/A  . Years of  education: N/A   Occupational History  . Not on file.   Social History Main Topics  . Smoking status: Never Smoker  . Smokeless tobacco: Never Used  . Alcohol use No  . Drug use: No  . Sexual activity: Not on file   Other Topics Concern  . Not on file   Social History Narrative  . No narrative on file    Review of Systems: See HPI, otherwise negative ROS  Physical Exam: BP (!) 159/69   Pulse 87   Temp 98.1 F (36.7 C) (Tympanic)   Resp 20   Ht 5' (1.524 m)   Wt 112 lb (50.8 kg)   SpO2 99%   BMI 21.87 kg/m  General:   Alert,  pleasant and cooperative in NAD Head:  Normocephalic and atraumatic. Neck:  Supple; no masses or thyromegaly. Lungs:  Clear throughout to auscultation.    Heart:  Regular rate and rhythm. Abdomen:  Soft, nontender and nondistended. Normal bowel sounds, without guarding, and without rebound.   Neurologic:  Alert and  oriented x4;  grossly normal neurologically.  Impression/Plan: Brooke Potts is here for an colonoscopy to be performed for gi bleed  Risks, benefits, limitations, and alternatives regarding  colonoscopy have been reviewed with the patient.  Questions have been answered.  All parties agreeable.   Wyline MoodKiran Yusuke Beza, MD  12/11/2016, 10:14 AM

## 2016-12-11 NOTE — Transfer of Care (Signed)
Immediate Anesthesia Transfer of Care Note  Patient: Brooke Potts  Procedure(s) Performed: Procedure(s): COLONOSCOPY WITH PROPOFOL (N/A)  Patient Location: Endoscopy Unit  Anesthesia Type:General  Level of Consciousness: sedated  Airway & Oxygen Therapy: Patient connected to nasal cannula oxygen  Post-op Assessment: Report given to RN  Post vital signs: stable  Last Vitals:  Vitals:   12/11/16 0913 12/11/16 1046  BP: (!) 159/69 (!) 117/53  Pulse: 87 90  Resp: 20 13  Temp: 36.7 C 37.3 C    Last Pain:  Vitals:   12/11/16 1046  TempSrc: Tympanic  PainSc:       Patients Stated Pain Goal: 0 (12/10/16 1842)  Complications: No apparent anesthesia complications

## 2016-12-11 NOTE — Op Note (Signed)
[facility] Gastroenterology Patient Name: Brooke Potts SeenLula Pitney Procedure Date: 12/11/2016 10:24 AM MRN: [id number] Account #: [id number] Date of Birth: Dec 27, 1922 Admit Type: Inpatient Age: 83 [facility] ROOM 1 Gender: Female Note Status: Finalized Procedure:            Colonoscopy Indications:          Melena Providers:            Wyline MoodKiran Damondre Pfeifle MD, MD Medicines:            Monitored Anesthesia Care Complications:        No immediate complications. Procedure:            Pre-Anesthesia Assessment:                       - Prior to the procedure, a History and Physical was                        performed, and patient medications, allergies and                        sensitivities were reviewed. The patient's tolerance of                        previous anesthesia was reviewed.                       - The risks and benefits of the procedure and the                        sedation options and risks were discussed with the                        patient. All questions were answered and informed                        consent was obtained.                       - ASA Grade Assessment: III - A patient with severe                        systemic disease.                       After obtaining informed consent, the colonoscope was                        passed under direct vision. Throughout the procedure,                        the patient's blood pressure, pulse, and oxygen                        saturations were monitored continuously. The                        Colonoscope was introduced through the anus and                        advanced to the the cecum, identified by the  appendiceal orifice, IC valve and transillumination.                        The colonoscopy was performed without difficulty. The                        patient tolerated the procedure well. The quality of                        the bowel preparation was good. Findings:  The perianal and digital rectal examinations were normal.      A few small-mouthed diverticula were found in the sigmoid colon.      The exam was otherwise without abnormality on direct and retroflexion       views. Impression:           - Diverticulosis in the sigmoid colon.                       - The examination was otherwise normal on direct and                        retroflexion views.                       - No specimens collected. Recommendation:       - Return patient to hospital ward for ongoing care.                       - Resume previous diet.                       - Continue present medications.                       - Return to my office PRN. Procedure Code(s):    --- Professional ---                       [phone], Colonoscopy, flexible; diagnostic, including                        collection of specimen(s) by brushing or washing, when                        performed (separate procedure) Diagnosis Code(s):    --- Professional ---                       K92.1, Melena (includes Hematochezia)                       K57.30, Diverticulosis of large intestine without                        perforation or abscess without bleeding CPT copyright 2016 American Medical Association. All rights reserved. The codes documented in this report are preliminary and upon coder review may  be revised to meet current compliance requirements. Wyline Mood, MD Wyline Mood MD, MD 12/11/2016 10:43:00 AM This report has been signed electronically. Number of Addenda: 0 Note Initiated On: 12/11/2016 10:24 AM Scope Withdrawal Time: 0 hours 8 minutes 26 seconds  Total Procedure Duration: 0 hours 12 minutes 17 seconds  [facility]

## 2016-12-11 NOTE — Progress Notes (Signed)
Alert and oriented. Vital signs stable . No signs of acute distress. Discharge instructions given. Patient verbalized understanding. No other issues noted at this time.    

## 2016-12-11 NOTE — Plan of Care (Signed)
Problem: Fluid Volume: Goal: Ability to maintain a balanced intake and output will improve Outcome: Not Progressing Patient is NPO.  Problem: Nutrition: Goal: Adequate nutrition will be maintained Outcome: Not Progressing Patient is NPO.   

## 2016-12-11 NOTE — Care Management (Signed)
Patient discharged home today with home health PT through [facility].  [facility] with [facility] Not[facility].  RNCM signing off.

## 2016-12-11 NOTE — Progress Notes (Signed)
   PT Note  Pt out of room for colonoscopy this AM.  Unavailable for session.  Will continue as appropriate.

## 2016-12-11 NOTE — Anesthesia Preprocedure Evaluation (Signed)
Anesthesia Evaluation  Patient identified by MRN, date of birth, ID band Patient awake    Reviewed: Allergy & Precautions, H&P , NPO status , Patient's Chart, lab work & pertinent test results, reviewed documented beta blocker date and time   Airway Mallampati: II   Neck ROM: full    Dental  (+) Poor Dentition   Pulmonary neg pulmonary ROS,    Pulmonary exam normal        Cardiovascular hypertension, + CAD  negative cardio ROS Normal cardiovascular exam+ dysrhythmias + pacemaker  Rhythm:regular Rate:Normal     Neuro/Psych negative neurological ROS  negative psych ROS   GI/Hepatic negative GI ROS, Neg liver ROS,   Endo/Other  negative endocrine ROS  Renal/GU negative Renal ROS  negative genitourinary   Musculoskeletal   Abdominal   Peds  Hematology negative hematology ROS (+)   Anesthesia Other Findings Past Medical History: No date: Coronary artery disease No date: Dysrhythmia No date: Hypertension No date: Pacemaker Past Surgical History: 12/09/2016: ESOPHAGOGASTRODUODENOSCOPY N/A     Comment: Procedure: ESOPHAGOGASTRODUODENOSCOPY (EGD);                Surgeon: Toney Reilohini Reddy Vanga, MD;  [facility]               [facility];  Service: Gastroenterology;                Laterality: N/A; No date: PACEMAKER INSERTION BMI    Body Mass Index:  22.05 kg/m     Reproductive/Obstetrics negative OB ROS                             Anesthesia Physical Anesthesia Plan  ASA: III  Anesthesia Plan: General   Post-op Pain Management:    Induction:   Airway Management Planned:   Additional Equipment:   Intra-op Plan:   Post-operative Plan:   Informed Consent: I have reviewed the patients History and Physical, chart, labs and discussed the procedure including the risks, benefits and alternatives for the proposed anesthesia with the patient or authorized representative who has indicated  his/her understanding and acceptance.   Dental Advisory Given  Plan Discussed with: CRNA  Anesthesia Plan Comments:         Anesthesia Quick Evaluation

## 2016-12-11 NOTE — Anesthesia Post-op Follow-up Note (Cosign Needed)
Anesthesia QCDR form completed.        

## 2016-12-12 ENCOUNTER — Encounter: Payer: Self-pay | Admitting: Gastroenterology

## 2016-12-12 NOTE — Anesthesia Postprocedure Evaluation (Signed)
Anesthesia Post Note  Patient: Brooke Potts  Procedure(s) Performed: Procedure(s) (LRB): COLONOSCOPY WITH PROPOFOL (N/A)  Patient location during evaluation: PACU Anesthesia Type: General Level of consciousness: awake and alert Pain management: pain level controlled Vital Signs Assessment: post-procedure vital signs reviewed and stable Respiratory status: spontaneous breathing, nonlabored ventilation, respiratory function stable and patient connected to nasal cannula oxygen Cardiovascular status: blood pressure returned to baseline and stable Postop Assessment: no signs of nausea or vomiting Anesthetic complications: no     Last Vitals:  Vitals:   12/11/16 1115 12/11/16 1205  BP: 132/62 (!) 156/56  Pulse: 79 84  Resp: 14 20  Temp:  36.3 C    Last Pain:  Vitals:   12/11/16 1205  TempSrc: Oral  PainSc:                  Brooke Potts

## 2016-12-14 LAB — FOLATE RBC
Folate, Hemolysate: 437 ng/mL
Folate, RBC: 1700 ng/mL (ref >498)
Hematocrit: 25.7 % — ABNORMAL LOW (ref 34.0–46.6)

## 2016-12-19 NOTE — Discharge Summary (Signed)
[facility] - [place] [facility]   PATIENT NAME: Brooke Potts    MR#:  [id number]  DATE OF BIRTH:  16-Sep-1923  DATE OF ADMISSION:  12/05/2016 ADMITTING PHYSICIAN: Houston Siren, MD  DATE OF DISCHARGE: 12/11/2016  2:58 PM  PRIMARY CARE PHYSICIAN: Rafael Bihari, MD   ADMISSION DIAGNOSIS:  Anemia, unspecified type [D64.9]  DISCHARGE DIAGNOSIS:  Active Problems:   GI bleed   SECONDARY DIAGNOSIS:   Past Medical History:  Diagnosis Date  . Coronary artery disease   . Dysrhythmia   . Hypertension   . Pacemaker      ADMITTING HISTORY  CHIEF COMPLAINT:      Chief Complaint  Patient presents with  . Fatigue    HISTORY OF PRESENT ILLNESS:  Brooke Potts  is a 83 y.o. female with a known history of Coronary artery disease, hypertension, history of atrial fibrillation status post pacemaker who presents to the hospital due to weakness, fatigue ongoing for the past few weeks. Patient says that she gets significantly short of breath when walking up her stairs and also gets dizzy and lightheaded. She went to see her primary care physician today who did some routine blood work and noted her to be significantly anemic and sent her to the ER for further evaluation. Patient does state that she's been seeing dark/melanotic stools now for a few weeks. She denies any abdominal pain, chest pain, nausea vomiting or any other associated symptoms present. She was noted to be significantly anemic and symptomatic with it and suspected to have an upper GI bleed and hospitalist services were contacted further treatment and evaluation.    HOSPITAL COURSE:   83 year old female with past medical history of hypertension, atrial fibrillation status post pacemaker me anxiety, glaucoma who presents to the hospital due to weakness and dizziness and noted to be anemic and also noted to have heme positive stools.  1. GI bleed Initial concern was for possible upper GI bleed due  to dark stools. EGD showed no acute findings. This was followed by a colonoscopy which showed diverticulosis which was thought to be the probable cause as patient was also on Coumadin.  Her Coumadin is being stopped. Patient will be on iron supplements. No further bleeding by the day of discharge. Seen by GI.  2. Acute blood loss Anemia - transfused 1 unit of packed RBC - Hb improved and stable Iron supplements at discharge  3. History of atrial fibrillation-patient is rate controlled and is status post pacemaker. - Hold Coumadin given the GI bleed and anemia. - Risk outweighs benefit and patient needs to be off coumadin. Discussed with patient and daughter. Patient will start a baby aspirin 4 days after discharge.  4. Essential hypertension-continue Lisinopril/HCTZ.  5. Glaucoma-continue latanoprost eyedrops.  6. Anxiety-continue as needed Xanax.  Stable for discharge home  CONSULTS OBTAINED:  Treatment Team:  Wyline Mood, MD  DRUG ALLERGIES:  No Known Allergies  DISCHARGE MEDICATIONS:   Discharge Medication List as of 12/11/2016  1:39 PM    START taking these medications   Details  aspirin EC 81 MG tablet Take 1 tablet (81 mg total) by mouth daily., Starting Tue 12/11/2016, No Print    ferrous sulfate 325 (65 FE) MG tablet Take 1 tablet (325 mg total) by mouth 2 (two) times daily with a meal., Starting Tue 12/11/2016, Normal    vitamin B-12 1000 MCG tablet Take 1 tablet (1,000 mcg total) by mouth daily., Starting Tue 12/11/2016, Normal  CONTINUE these medications which have NOT CHANGED   Details  acetaminophen (TYLENOL) 325 MG tablet Take 650 mg by mouth every 6 (six) hours as needed., Historical Med    ALPRAZolam (XANAX) 0.25 MG tablet Take 0.25 mg by mouth 3 (three) times daily as needed., Starting Sat 10/13/2016, Historical Med    Cranberry 450 MG TABS Take 450 mg by mouth 2 (two) times daily., Historical Med    latanoprost (XALATAN) 0.005 % ophthalmic  solution Place 1 drop into both eyes at bedtime., Starting Sun 09/19/2014, Historical Med    lisinopril-hydrochlorothiazide (PRINZIDE,ZESTORETIC) 20-25 MG tablet Take 1 tablet by mouth daily., Starting Thu 11/22/2016, Historical Med      STOP taking these medications     warfarin (COUMADIN) 2 MG tablet         Today   VITAL SIGNS:  Blood pressure (!) 156/56, pulse 84, temperature 97.4 F (36.3 C), temperature source Oral, resp. rate 20, height 5' (1.524 m), weight 50.8 kg (112 lb), SpO2 99 %.  I/O:  No intake or output data in the 24 hours ending 12/19/16 1433  PHYSICAL EXAMINATION:  Physical Exam  GENERAL:  83 y.o.-year-old patient lying in the bed with no acute distress.  LUNGS: Normal breath sounds bilaterally, no wheezing, rales,rhonchi or crepitation. No use of accessory muscles of respiration.  CARDIOVASCULAR: S1, S2 normal. No murmurs, rubs, or gallops.  ABDOMEN: Soft, non-tender, non-distended. Bowel sounds present. No organomegaly or mass.  NEUROLOGIC: Moves all 4 extremities. PSYCHIATRIC: The patient is alert and oriented x 3.  SKIN: No obvious rash, lesion, or ulcer.   DATA REVIEW:   CBC No results for input(s): WBC, HGB, HCT, PLT in the last 168 hours.  Chemistries  No results for input(s): NA, K, CL, CO2, GLUCOSE, BUN, CREATININE, CALCIUM, MG, AST, ALT, ALKPHOS, BILITOT in the last 168 hours.  Invalid input(s): GFRCGP  Cardiac Enzymes No results for input(s): TROPONINI in the last 168 hours.  Microbiology Results  No results found for this or any previous visit.  RADIOLOGY:  No results found.  Follow up with PCP in 1 week.  Management plans discussed with the patient, family and they are in agreement.  CODE STATUS:  Code Status History    Date Active Date Inactive Code Status Order ID Comments User Context   12/05/2016  4:24 PM 12/11/2016  6:03 PM Full Code [id number]  Houston SirenVivek J Sainani, MD Inpatient    Advance Directive Documentation   Flowsheet Row  Most Recent Value  Type of Advance Directive  Healthcare Power of Attorney, Living will  Pre-existing out of facility DNR order (yellow form or pink MOST form)  No data  "MOST" Form in Place?  No data      TOTAL TIME TAKING CARE OF THIS PATIENT ON DAY OF DISCHARGE: more than 30 minutes.   Milagros LollSudini, Rice Walsh R M.D on 12/19/2016 at 2:33 PM  Between 7am to 6pm - Pager - [phone]  After 6pm go to [email or web address] - password [email or web address]  [facility]  Office  [phone]  CC: Primary care physician; Rafael BihariWALKER III, JOHN B, MD  Note: This dictation was prepared with Dragon dictation along with smaller phrase technology. Any transcriptional errors that result from this process are unintentional.

## 2017-01-03 ENCOUNTER — Other Ambulatory Visit: Payer: Self-pay

## 2017-02-04 ENCOUNTER — Other Ambulatory Visit: Payer: Self-pay | Admitting: Internal Medicine

## 2017-02-12 ENCOUNTER — Other Ambulatory Visit: Payer: Self-pay | Admitting: Internal Medicine

## 2017-02-12 DIAGNOSIS — Z1231 Encounter for screening mammogram for malignant neoplasm of breast: Secondary | ICD-10-CM

## 2017-03-19 ENCOUNTER — Ambulatory Visit
Admission: RE | Admit: 2017-03-19 | Discharge: 2017-03-19 | Disposition: A | Discharge status: Home / Self Care | Payer: Medicare Other | Source: Ambulatory Visit | Attending: Internal Medicine | Admitting: Internal Medicine

## 2017-03-19 DIAGNOSIS — Z1231 Encounter for screening mammogram for malignant neoplasm of breast: Secondary | ICD-10-CM | POA: Diagnosis not present

## 2017-05-10 ENCOUNTER — Encounter: Payer: Self-pay | Admitting: Emergency Medicine

## 2017-05-10 ENCOUNTER — Emergency Department
Admission: EM | Admit: 2017-05-10 | Discharge: 2017-05-10 | Disposition: A | Discharge status: Home / Self Care | Payer: Medicare Other | Attending: Emergency Medicine | Admitting: Emergency Medicine

## 2017-05-10 ENCOUNTER — Emergency Department: Payer: Medicare Other

## 2017-05-10 DIAGNOSIS — S32020A Wedge compression fracture of second lumbar vertebra, initial encounter for closed fracture: Secondary | ICD-10-CM | POA: Insufficient documentation

## 2017-05-10 DIAGNOSIS — Z7982 Long term (current) use of aspirin: Secondary | ICD-10-CM | POA: Diagnosis not present

## 2017-05-10 DIAGNOSIS — K5900 Constipation, unspecified: Secondary | ICD-10-CM | POA: Insufficient documentation

## 2017-05-10 DIAGNOSIS — I2510 Atherosclerotic heart disease of native coronary artery without angina pectoris: Secondary | ICD-10-CM | POA: Diagnosis not present

## 2017-05-10 DIAGNOSIS — W108XXA Fall (on) (from) other stairs and steps, initial encounter: Secondary | ICD-10-CM | POA: Diagnosis not present

## 2017-05-10 DIAGNOSIS — Z79899 Other long term (current) drug therapy: Secondary | ICD-10-CM | POA: Diagnosis not present

## 2017-05-10 DIAGNOSIS — I10 Essential (primary) hypertension: Secondary | ICD-10-CM | POA: Insufficient documentation

## 2017-05-10 DIAGNOSIS — Y929 Unspecified place or not applicable: Secondary | ICD-10-CM | POA: Diagnosis not present

## 2017-05-10 DIAGNOSIS — Y939 Activity, unspecified: Secondary | ICD-10-CM | POA: Insufficient documentation

## 2017-05-10 DIAGNOSIS — S3992XA Unspecified injury of lower back, initial encounter: Secondary | ICD-10-CM | POA: Diagnosis present

## 2017-05-10 DIAGNOSIS — Y999 Unspecified external cause status: Secondary | ICD-10-CM | POA: Diagnosis not present

## 2017-05-10 MED ORDER — DOCUSATE SODIUM 100 MG PO CAPS: 100.0000 mg | ORAL_CAPSULE | Freq: Two times a day (BID) | ORAL | 0 refills | Status: AC

## 2017-05-10 MED ORDER — ACETAMINOPHEN 500 MG PO TABS
1000.0000 mg | ORAL_TABLET | Freq: Once | ORAL | Status: AC
  Administered 2017-05-10: 1000 mg via ORAL
  Filled 2017-05-10: qty 2

## 2017-05-10 MED ORDER — TRAMADOL HCL 50 MG PO TABS: 50.0000 mg | ORAL_TABLET | Freq: Four times a day (QID) | ORAL | 0 refills | Status: DC | PRN

## 2017-05-10 NOTE — Discharge Instructions (Signed)
Please call the number provided for orthopedics to arrange a follow-up appointment with Dr. Rosita KeaMenz for further evaluation. Please take your pain medication as needed, as prescribed. If you are taking pain medication please take your Colace as prescribed twice daily. Return to the emergency department for any acute worsening of pain, or any other symptom personally concerning to yourself.

## 2017-05-10 NOTE — ED Notes (Signed)
Pt reports being able to pass some urine at this time

## 2017-05-10 NOTE — ED Notes (Signed)
Pt assisted to the toilet upon request after placing her wheelchair at the toilet. Pt was able to get to the toilet without difficulty, pt c/o now being unable to urinate

## 2017-05-10 NOTE — ED Triage Notes (Signed)
Pt to ED via POV c/o fall last Monday. Since then pt has been having pain in her lower back. Pt states that she saw her PCP, no x-rays were taken. Pt also states that she has not had a bowel movement since Tuesday. Pt states that her pain is 10/10.

## 2017-05-10 NOTE — ED Notes (Signed)
Warm blanket was given to patient 

## 2017-05-10 NOTE — ED Notes (Signed)
ED Charge RN notified of need of room for enema administration.

## 2017-05-10 NOTE — ED Provider Notes (Signed)
Patient has small stool with an MRI and a larger stool in the bed. She feels much better we will let her go as planned.   Arnaldo NatalMalinda, Maejor Erven F, MD 05/10/17 [phone]

## 2017-05-10 NOTE — ED Notes (Signed)
1500mL of soap suds enema given to patient. Patient had 3 very small circular bowel movements. Patient states, "That didn't work". Danelle EarthlyNoel, Primary RN aware.

## 2017-05-10 NOTE — ED Notes (Signed)
Pt cleaned and clothes changed after pt had significant BM in the the bed, Dr Darnelle CatalanMalinda assessed

## 2017-05-10 NOTE — ED Provider Notes (Signed)
[facility] Emergency Department Provider Note  Time seen: 2:19 PM  I have reviewed the triage vital signs and the nursing notes.   HISTORY  Chief Complaint Back Pain    HPI Brooke Potts is a 83 y.o. female with a past medical history of hypertension, presents to the emergency department with lower back pain after a fall. According to the patient she fell 11 days ago on the last stair, causing her to fall forward into roll over. Patient states since the fall she has had moderate lower back pain worse with movement. She has seen her doctor for this but no x-rays were taken. She also states she began taking tramadol for the pain and has been constipated for the past 9 or 10 days. Denies any abdominal pain nausea or vomiting states her back pain as dull aching pain moderate in severity. Denies incontinence. Denies numbness or weakness.  Past Medical History:  Diagnosis Date  . Coronary artery disease   . Dysrhythmia   . Hypertension   . Pacemaker     Patient Active Problem List   Diagnosis Date Noted  . GI bleed 12/05/2016  . Chronic vertigo 05/04/2015  . History of CVA (cerebrovascular accident) 11/01/2014  . Hypercholesterolemia 11/01/2014  . Sick sinus syndrome (HCC) 11/01/2014  . HTN (hypertension) 04/28/2014  . Pacemaker 04/28/2014  . TIA (transient ischemic attack) 04/28/2014  . Atrial fibrillation (HCC) 02/11/2014    Past Surgical History:  Procedure Laterality Date  . COLONOSCOPY WITH PROPOFOL N/A 12/11/2016   Procedure: COLONOSCOPY WITH PROPOFOL;  Surgeon: Wyline MoodKiran Anna, MD;  Location[facility] ENDOSCOPY;  Service: Gastroenterology;  Laterality: N/A;  . ESOPHAGOGASTRODUODENOSCOPY N/A 12/09/2016   Procedure: ESOPHAGOGASTRODUODENOSCOPY (EGD);  Surgeon: Toney Reilohini Reddy Vanga, MD;  [facility] END[facility]COPY;  Service: Gastroenterology;  Laterality: N/A;  . PACEMAKER INSERTION      Prior to Admission medications   Medication Sig Start Date End Date  Taking? Authorizing Provider  acetaminophen (TYLENOL) 325 MG tablet Take 650 mg by mouth every 6 (six) hours as needed.    [provider]  ALPRAZolam Prudy Feeler(XANAX) 0.25 MG tablet Take 0.25 mg by mouth 3 (three) times daily as needed. 10/13/16   [provider]  aspirin EC 81 MG tablet Take 1 tablet (81 mg total) by mouth daily. 12/11/16   Milagros LollSudini, Srikar, MD  Cranberry 450 MG TABS Take 450 mg by mouth 2 (two) times daily.    [provider]  ferrous sulfate 325 (65 FE) MG tablet Take 1 tablet (325 mg total) by mouth 2 (two) times daily with a meal. 12/11/16   Sudini, Srikar, MD  latanoprost (XALATAN) 0.005 % ophthalmic solution Place 1 drop into both eyes at bedtime. 09/19/14   [provider]  lisinopril-hydrochlorothiazide (PRINZIDE,ZESTORETIC) 20-25 MG tablet Take 1 tablet by mouth daily. 11/22/16   [provider]  predniSONE (DELTASONE) 20 MG tablet  10/02/16   [provider]  simvastatin (ZOCOR) 20 MG tablet  10/12/16   [provider]  vitamin B-12 1000 MCG tablet Take 1 tablet (1,000 mcg total) by mouth daily. 12/11/16   Milagros LollSudini, Srikar, MD    No Known Allergies  Family History  Problem Relation Age of Onset  . Breast cancer Mother [id number]  . Peripheral vascular disease Mother   . Diabetes Mother   . Stroke Father   . Diabetes Father   . Diabetes Sister   . Diabetes Brother     Social History Social History  Substance Use Topics  .  Smoking status: Never Smoker  . Smokeless tobacco: Never Used  . Alcohol use No    Review of Systems Constitutional: Negative for fever Cardiovascular: Negative for chest pain. Respiratory: Negative for shortness of breath. Gastrointestinal: Negative for abdominal pain, Nausea or vomiting. Positive for constipation. Genitourinary: Negative for incontinence. Musculoskeletal: Moderate lower back pain Skin: Bruise to left shoulder Neurological: Negative for headache. No focal weakness or  numbness. All other ROS negative  ____________________________________________   PHYSICAL EXAM:  VITAL SIGNS: ED Triage Vitals  Enc Vitals Group     BP 05/10/17 1151 (!) 155/102     Pulse Rate 05/10/17 1151 93     Resp 05/10/17 1151 18     Temp 05/10/17 1151 97.7 F (36.5 C)     Temp Source 05/10/17 1151 Oral     SpO2 05/10/17 1151 100 %     Weight 05/10/17 1152 119 lb (54 kg)     Height 05/10/17 1152 5\' 1"  (1.549 m)     Head Circumference --      Peak Flow --      Pain Score 05/10/17 1205 10     Pain Loc --      Pain Edu? --      Excl. in GC? --     Constitutional: Alert and oriented. Well appearing and in no distress.Younger appearing than stated age. Eyes: Normal exam ENT   Head: Normocephalic and atraumatic.   Mouth/Throat: Mucous membranes are moist. Cardiovascular: Normal rate, regular rhythm.  Respiratory: Normal respiratory effort without tachypnea nor retractions. Breath sounds are clear Gastrointestinal: Soft and nontender. No distention.   Musculoskeletal: Moderate mid lumbar tenderness to palpation. Neurologic:  Normal speech and language. No gross focal neurologic deficits  Skin:  Skin is warm, dry and intact.  Psychiatric: Mood and affect are normal.   ____________________________________________  RADIOLOGY  IMPRESSION: 1. Age indeterminate mild compression fracture involving the upper endplate of L2 on the order of 15-20% or so. Correlation with point tenderness is recommended in order to assist in determining whether this is acute/subacute. 2. No fractures elsewhere. 3. Multilevel degenerative disc disease, worst at L5-S1. 4. Facet degenerative changes at L4-5 and L5-S1.   IMPRESSION: 1. No acute abdominal abnormality. Moderate colonic stool burden. 2. Cholelithiasis. 3. Aortic Atherosclerosis (ICD10-170.0) ____________________________________________   INITIAL IMPRESSION / ASSESSMENT AND PLAN / ED COURSE  Pertinent labs &  imaging results that were available during my care of the patient were reviewed by me and considered in my medical decision making (see chart for details).  Patient presents to the emergency department for lower back pain. Patient has moderate lumbar tenderness to palpation. Patient's x-rays shows an L2 20% compression fracture this is likely acute given the tenderness to palpation in the area, no other fractures identified. No focal deficits identified. Patient also states she's not had a bowel movement since starting tramadol 10 days ago. She has since discontinued use of tramadol but continues to be constipated. Once a room becomes available will move the patient into a room, attempt an enema for constipation relief. Highly suspect the patient's discomfort is coming from a 20% compression fracture of L2. We will discharge on a short course of pain medication in addition to Colace 100 mg twice daily ball taking the pain medicine and have the patient follow-up with Dr. Rosita KeaMenz in orthopedics. Patient agreeable to this plan.  Currently awaiting a room to open to place the patient in the room before proceeding with an enema. Patient care signed  out to Dr. Darnelle Catalan.    ____________________________________________   FINAL CLINICAL IMPRESSION(S) / ED DIAGNOSES  Compression fracture Constipation    Minna Antis, MD 05/10/17 1501

## 2017-05-15 ENCOUNTER — Emergency Department: Payer: Medicare Other

## 2017-05-15 ENCOUNTER — Encounter: Payer: Self-pay | Admitting: Emergency Medicine

## 2017-05-15 ENCOUNTER — Emergency Department
Admission: EM | Admit: 2017-05-15 | Discharge: 2017-05-15 | Disposition: A | Discharge status: Home / Self Care | Payer: Medicare Other | Attending: Emergency Medicine | Admitting: Emergency Medicine

## 2017-05-15 DIAGNOSIS — Z7982 Long term (current) use of aspirin: Secondary | ICD-10-CM | POA: Diagnosis not present

## 2017-05-15 DIAGNOSIS — Z950 Presence of cardiac pacemaker: Secondary | ICD-10-CM | POA: Insufficient documentation

## 2017-05-15 DIAGNOSIS — K5900 Constipation, unspecified: Secondary | ICD-10-CM | POA: Diagnosis not present

## 2017-05-15 DIAGNOSIS — K8020 Calculus of gallbladder without cholecystitis without obstruction: Secondary | ICD-10-CM | POA: Insufficient documentation

## 2017-05-15 DIAGNOSIS — I10 Essential (primary) hypertension: Secondary | ICD-10-CM | POA: Diagnosis not present

## 2017-05-15 DIAGNOSIS — Z79899 Other long term (current) drug therapy: Secondary | ICD-10-CM | POA: Diagnosis not present

## 2017-05-15 DIAGNOSIS — I2510 Atherosclerotic heart disease of native coronary artery without angina pectoris: Secondary | ICD-10-CM | POA: Insufficient documentation

## 2017-05-15 DIAGNOSIS — Z8673 Personal history of transient ischemic attack (TIA), and cerebral infarction without residual deficits: Secondary | ICD-10-CM | POA: Diagnosis not present

## 2017-05-15 DIAGNOSIS — R109 Unspecified abdominal pain: Secondary | ICD-10-CM | POA: Diagnosis present

## 2017-05-15 LAB — CBC
HEMATOCRIT: 44.6 % (ref 35.0–47.0)
HEMOGLOBIN: 15.4 g/dL (ref 12.0–16.0)
MCH: 32 pg (ref 26.0–34.0)
MCHC: 34.4 g/dL (ref 32.0–36.0)
MCV: 92.9 fL (ref 80.0–100.0)
Platelets: 413 10*3/uL (ref 150–440)
RBC: 4.8 MIL/uL (ref 3.80–5.20)
RDW: 14.5 % (ref 11.5–14.5)
WBC: 11.3 10*3/uL — ABNORMAL HIGH (ref 3.6–11.0)

## 2017-05-15 LAB — COMPREHENSIVE METABOLIC PANEL
ALK PHOS: 139 U/L — AB (ref 38–126)
ALT: 18 U/L (ref 14–54)
ANION GAP: 9 (ref 5–15)
AST: 25 U/L (ref 15–41)
Albumin: 3.6 g/dL (ref 3.5–5.0)
BILIRUBIN TOTAL: 0.6 mg/dL (ref 0.3–1.2)
BUN: 19 mg/dL (ref 6–20)
CALCIUM: 9.2 mg/dL (ref 8.9–10.3)
CO2: 28 mmol/L (ref 22–32)
CREATININE: 0.96 mg/dL (ref 0.44–1.00)
Chloride: 95 mmol/L — ABNORMAL LOW (ref 101–111)
GFR calc non Af Amer: 50 mL/min — ABNORMAL LOW (ref >60)
GFR, EST AFRICAN AMERICAN: 58 mL/min — AB (ref >60)
GLUCOSE: 114 mg/dL — AB (ref 65–99)
Potassium: 3.1 mmol/L — ABNORMAL LOW (ref 3.5–5.1)
Sodium: 132 mmol/L — ABNORMAL LOW (ref 135–145)
TOTAL PROTEIN: 6.8 g/dL (ref 6.5–8.1)

## 2017-05-15 LAB — LIPASE, BLOOD: Lipase: 45 U/L (ref 11–51)

## 2017-05-15 MED ORDER — POTASSIUM CHLORIDE ER 10 MEQ PO TBCR: 20.0000 meq | EXTENDED_RELEASE_TABLET | Freq: Every day | ORAL | 0 refills | Status: DC

## 2017-05-15 MED ORDER — SODIUM CHLORIDE 0.9 % IV BOLUS (SEPSIS)
500.0000 mL | Freq: Once | INTRAVENOUS | Status: AC
  Administered 2017-05-15: 500 mL via INTRAVENOUS

## 2017-05-15 NOTE — ED Triage Notes (Signed)
Pt in via [facility] from home with complaints of RLQ abdominal pain since last Tuesday, per EMS no BM x 8 days, pt seen here recently for same, given enema and stool softeners without any success.  Pt reports 10/10 pain.  NAD noted at this time.

## 2017-05-15 NOTE — ED Notes (Signed)
Patient verbalizes understanding of d/c instructions and follow-up. VS stable and pain controlled per patient.  Patient in NAD at time of d/c and denies further concerns regarding this visit. Patient stable at the time of departure from the unit, departing unit by the safest and most appropriate manner per that patients condition and limitations. Patient advised to return to the ED at any time for emergent concerns, or for new/worsening symptoms.   

## 2017-05-15 NOTE — ED Provider Notes (Signed)
[facility] Emergency Department Provider Note  ____________________________________________   I have reviewed the triage vital signs and the nursing notes.   HISTORY  Chief Complaint Constipation and Abdominal Pain    HPI Brooke Potts is a 83 y.o. female who is here because she feels that she is likely constipated. Her daughter states she is not constipated. According to her daughter, patient has been taking many laxatives and having many bowel movements. However, given baseline dementia the patient forgets about it and becomes convinced that she is constipated. She is not complaining of abdominal pain, she is acting otherwise normally, she is demented at baseline. There are, therefore, some limitations to the workup.   Past Medical History:  Diagnosis Date  . Coronary artery disease   . Dysrhythmia   . Hypertension   . Pacemaker     Patient Active Problem List   Diagnosis Date Noted  . GI bleed 12/05/2016  . Chronic vertigo 05/04/2015  . History of CVA (cerebrovascular accident) 11/01/2014  . Hypercholesterolemia 11/01/2014  . Sick sinus syndrome (HCC) 11/01/2014  . HTN (hypertension) 04/28/2014  . Pacemaker 04/28/2014  . TIA (transient ischemic attack) 04/28/2014  . Atrial fibrillation (HCC) 02/11/2014    Past Surgical History:  Procedure Laterality Date  . COLONOSCOPY WITH PROPOFOL N/A 12/11/2016   Procedure: COLONOSCOPY WITH PROPOFOL;  Surgeon: Wyline Mood, MD;  Location: [facility] ENDOSCOPY;  Service: Gastroenterology;  Laterality: N/A;  . ESOPHAGOGASTRODUODENOSCOPY N/A 12/09/2016   Procedure: ESOPHAGOGASTRODUODENOSCOPY (EGD);  Surgeon: Toney Reil, MD;  Location[facility] ENDOSCOPY;  Service: Gastroenterology;  Laterality: N/A;  . PACEMAKER INSERTION      Prior to Admission medications   Medication Sig Start Date End Date Taking? Authorizing Provider  acetaminophen (TYLENOL) 325 MG tablet Take 650 mg by mouth every 6 (six) hours as  needed.    [provider]  ALPRAZolam Prudy Feeler) 0.25 MG tablet Take 0.25 mg by mouth 3 (three) times daily as needed. 10/13/16   [provider]  aspirin EC 81 MG tablet Take 1 tablet (81 mg total) by mouth daily. 12/11/16   Milagros Loll, MD  Cranberry 450 MG TABS Take 450 mg by mouth 2 (two) times daily.    [provider]  docusate sodium (COLACE) 100 MG capsule Take 1 capsule (100 mg total) by mouth 2 (two) times daily. 05/10/17 05/10/18  Minna Antis, MD  ferrous sulfate 325 (65 FE) MG tablet Take 1 tablet (325 mg total) by mouth 2 (two) times daily with a meal. 12/11/16   Sudini, Srikar, MD  latanoprost (XALATAN) 0.005 % ophthalmic solution Place 1 drop into both eyes at bedtime. 09/19/14   [provider]  lisinopril-hydrochlorothiazide (PRINZIDE,ZESTORETIC) 20-25 MG tablet Take 1 tablet by mouth daily. 11/22/16   [provider]  predniSONE (DELTASONE) 20 MG tablet  10/02/16   [provider]  simvastatin (ZOCOR) 20 MG tablet  10/12/16   [provider]  traMADol (ULTRAM) 50 MG tablet Take 1 tablet (50 mg total) by mouth every 6 (six) hours as needed. 05/10/17   Minna Antis, MD  vitamin B-12 1000 MCG tablet Take 1 tablet (1,000 mcg total) by mouth daily. 12/11/16   Milagros Loll, MD    Allergies Tramadol  Family History  Problem Relation Age of Onset  . Breast cancer Mother 2  . Peripheral vascular disease Mother   . Diabetes Mother   . Stroke Father   . Diabetes Father   . Diabetes Sister   . Diabetes  Brother     Social History Social History  Substance Use Topics  . Smoking status: Never Smoker  . Smokeless tobacco: Never Used  . Alcohol use No    Review of Systems Constitutional: No fever/chills Eyes: No visual changes. ENT: No sore throat. No stiff neck no neck pain Cardiovascular: Denies chest pain. Respiratory: Denies shortness of breath. Gastrointestinal:   no vomiting.  No diarrhea.  No  constipation. Genitourinary: Negative for dysuria. Musculoskeletal: Negative lower extremity swelling Skin: Negative for rash. Neurological: Negative for severe headaches, focal weakness or numbness.   ____________________________________________   PHYSICAL EXAM:  VITAL SIGNS: ED Triage Vitals  Enc Vitals Group     BP 05/15/17 1418 (!) 183/80     Pulse Rate 05/15/17 1418 94     Resp 05/15/17 1418 20     Temp 05/15/17 1418 97.7 F (36.5 C)     Temp Source 05/15/17 1418 Oral     SpO2 05/15/17 1418 100 %     Weight 05/15/17 1416 101 lb (45.8 kg)     Height 05/15/17 1416 5\' 2"  (1.575 m)     Head Circumference --      Peak Flow --      Pain Score 05/15/17 1415 10     Pain Loc --      Pain Edu? --      Excl. in GC? --     Constitutional: Alert and Pleasantly demented, oriented to name and place. Well appearing and in no acute distress. Eyes: Conjunctivae are normal Head: Atraumatic HEENT: No congestion/rhinnorhea. Mucous membranes are moist.  Oropharynx non-erythematous Neck:   Nontender with no meningismus, no masses, no stridor Cardiovascular: Normal rate, regular rhythm. Grossly normal heart sounds.  Good peripheral circulation. Respiratory: Normal respiratory effort.  No retractions. Lungs CTAB. Abdominal: Soft and nontender. No distention. No guarding no rebound Back:  There is no focal tenderness or step off.  there is no midline tenderness there are no lesions noted. there is no CVA tenderness Musculoskeletal: No lower extremity tenderness, no upper extremity tenderness. No joint effusions, no DVT signs strong distal pulses no edema Neurologic:  Normal speech and language. No gross focal neurologic deficits are appreciated.  Skin:  Skin is warm, dry and intact. No rash noted. Psychiatric: Mood and affect are normal. Speech and behavior are normal.  ____________________________________________   LABS (all labs ordered are listed, but only abnormal results are  displayed)  Labs Reviewed  CBC - Abnormal; Notable for the following:       Result Value   WBC 11.3 (*)    All other components within normal limits  COMPREHENSIVE METABOLIC PANEL - Abnormal; Notable for the following:    Sodium 132 (*)    Potassium 3.1 (*)    Chloride 95 (*)    Glucose, Bld 114 (*)    Alkaline Phosphatase 139 (*)    GFR calc non Af Amer 50 (*)    GFR calc Af Amer 58 (*)    All other components within normal limits  LIPASE, BLOOD   ____________________________________________  EKG  I personally interpreted any EKGs ordered by me or triage   __________________________________________  RADIOLOGY  I reviewed any imaging ordered by me or triage that were performed during my shift and, if possible, patient and/or family made aware of any abnormal findings. ____________________________________________   PROCEDURES  Procedure(s) performed: None  Procedures  Critical Care performed: None  ____________________________________________   INITIAL IMPRESSION / ASSESSMENT AND PLAN / ED  COURSE  Pertinent labs & imaging results that were available during my care of the patient were reviewed by me and considered in my medical decision making (see chart for details).  Patient with some degree of anxiety and concern about her "bowels". However there is no evidence of constipation clinically, I did do a CT scan given her age and it is re-assuring, there is noted a gallstone but there is no evidence of cholecystitis and she has also no tenderness there. The real abdominal exams are quite benign. The daughter is requesting discharge. I've actually advised him to stop taking laxatives and stool softeners for a couple days and see if that helps the patient feel better. There is no evidence of acute infectious pathology. Alkaline phosphatase is slightly elevated but the rest of her blood work is reassuring, patient family made aware of findings including ultrasound findings  of gallstone. They will follow up with primary care. However is no evidence of anemia, renal function is preserved etc. Patient family here to go home tolerating by mouth. We'll discharge with close up vision follow-up. Initial blood pressure was somewhat elevated however as a chief complaint of constipation, I don't think we will pursue possible cardiac etiologies that she is actually complaining of 0 pain and has not been for her daughter.    ____________________________________________   FINAL CLINICAL IMPRESSION(S) / ED DIAGNOSES  Final diagnoses:  Abdominal pain      This chart was dictated using voice recognition software.  Despite best efforts to proofread,  errors can occur which can change meaning.      Jeanmarie PlantMcShane, Lesli Issa A, MD 05/15/17 [phone]

## 2017-06-12 DIAGNOSIS — Z8781 Personal history of (healed) traumatic fracture: Secondary | ICD-10-CM | POA: Insufficient documentation

## 2017-08-08 IMAGING — US US ABDOMEN LIMITED
1 series · 14 of 25 positions shown · non-contrast
Comparison: CT Abdomen and Pelvis without contrast 0276 hours
today.

CLINICAL DATA: [AGE] female with abdominal pain for 8 days.

EXAM:
ULTRASOUND ABDOMEN LIMITED RIGHT UPPER QUADRANT

[Series 1: us abdomen limited · 0.17mm/px · 14 of 50 slices shown]
[im 1/50]
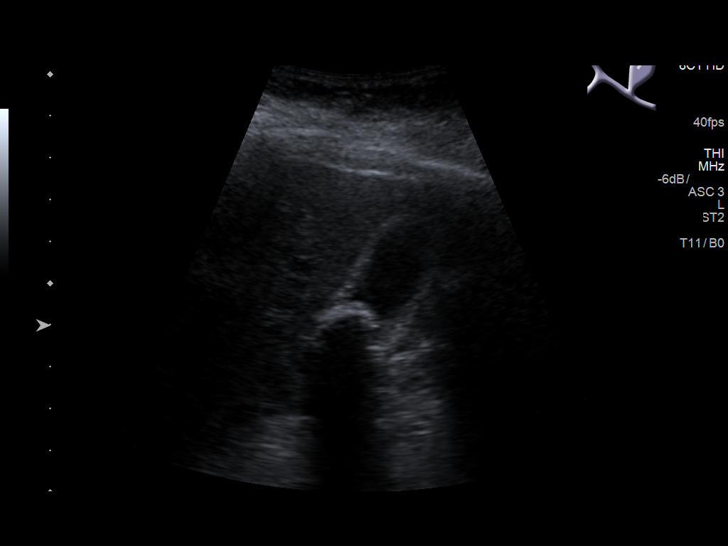
[im 5/50]
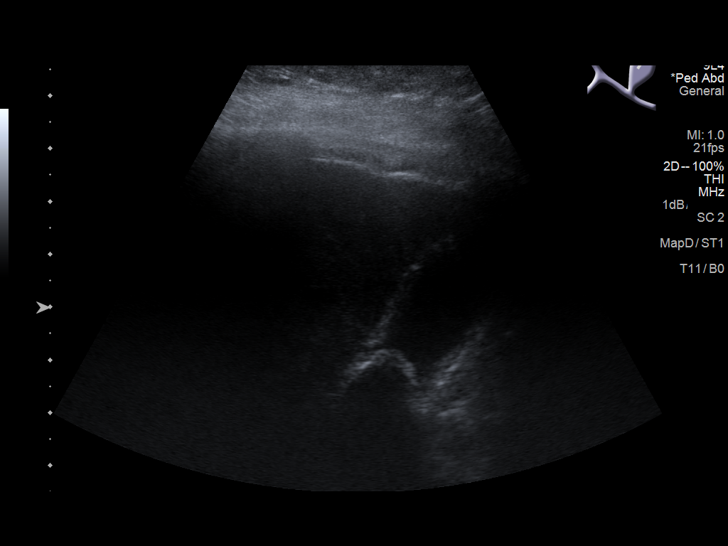
[im 9/50]
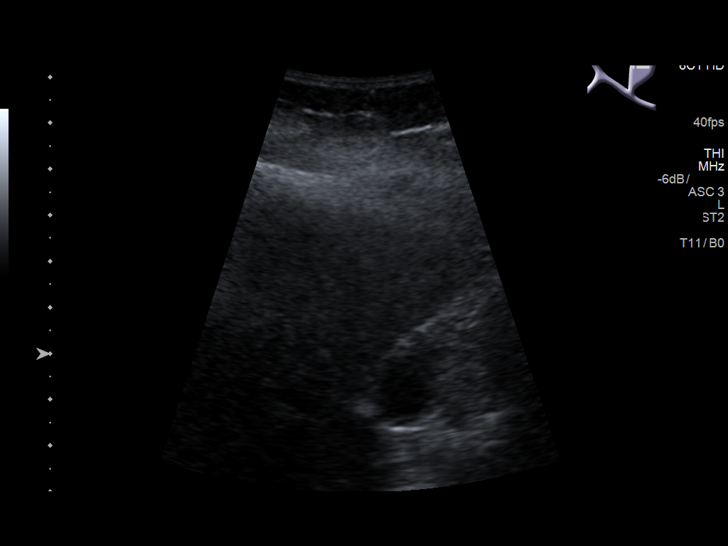
[im 13/50]
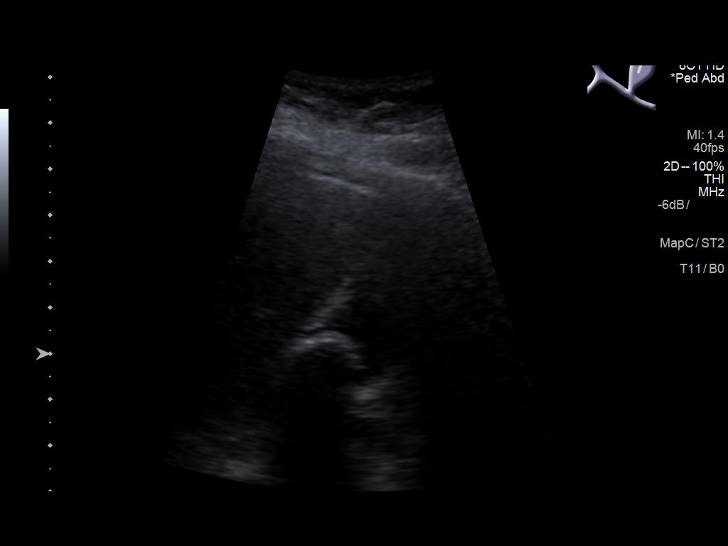
[im 17/50]
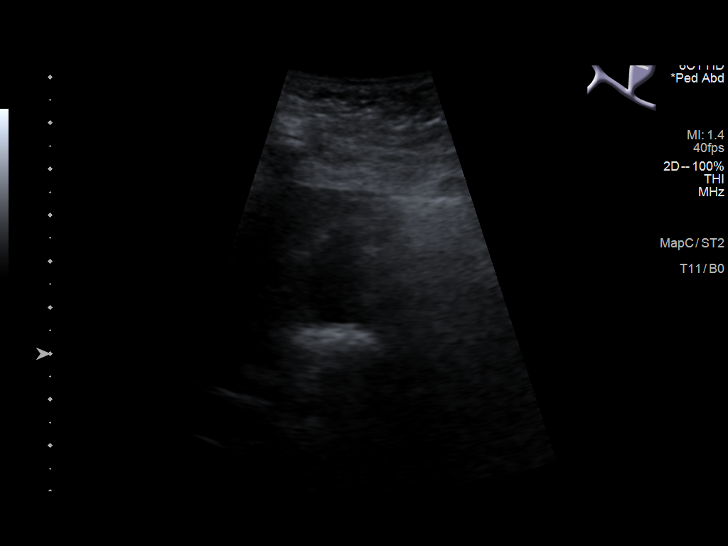
[im 19/50]
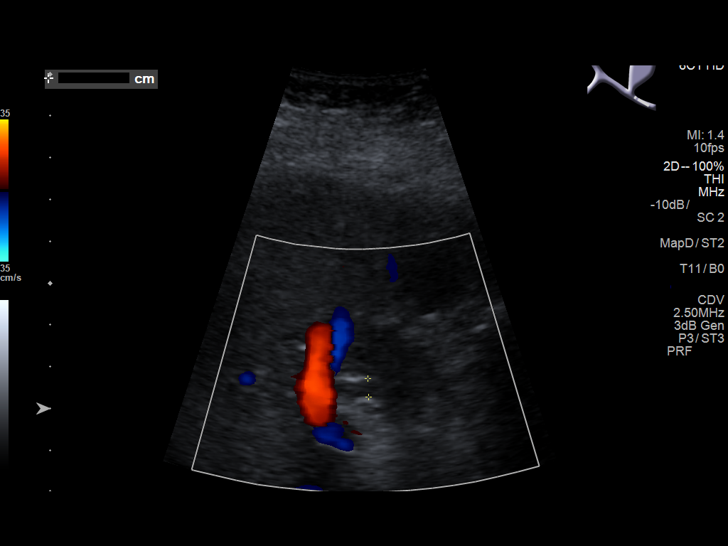
[im 23/50]
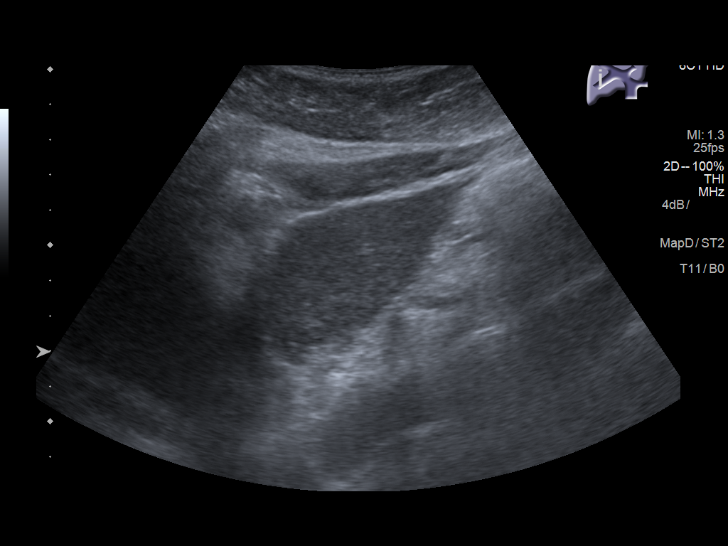
[im 27/50]
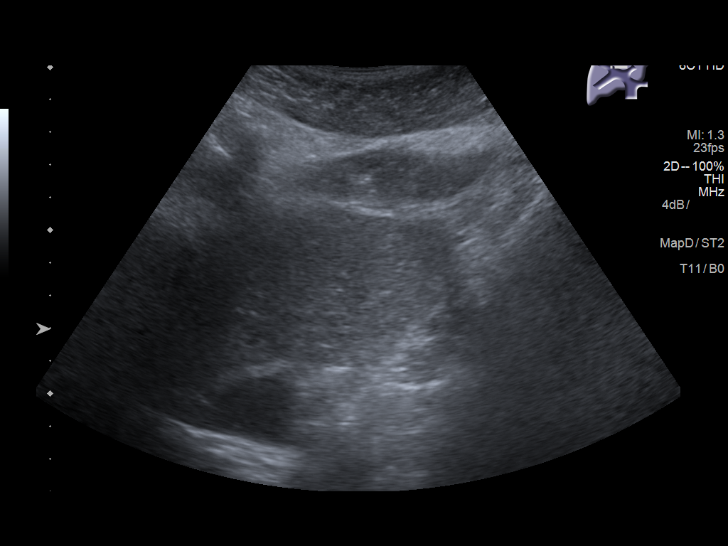
[im 31/50]
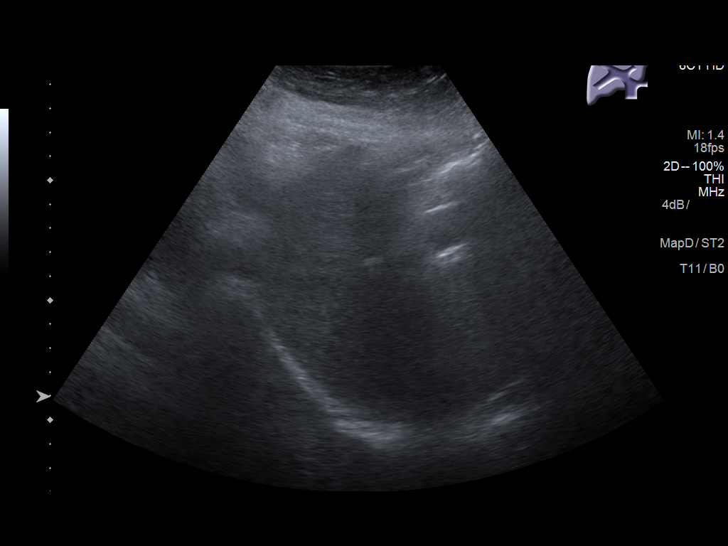
[im 33/50]
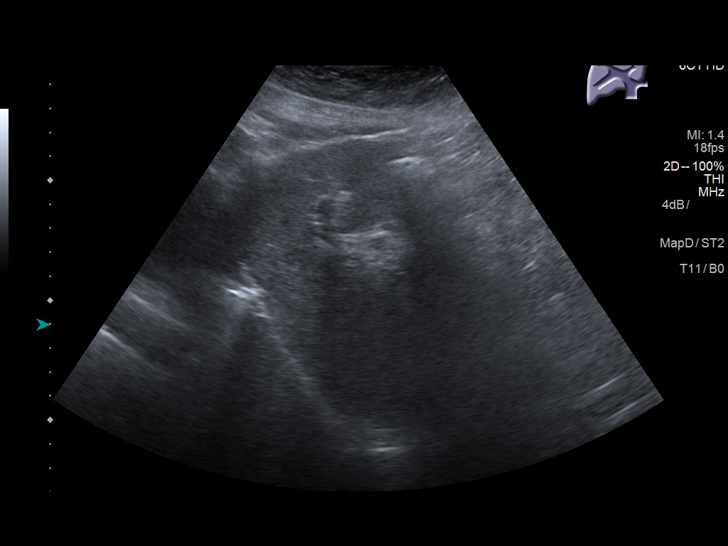
[im 37/50]
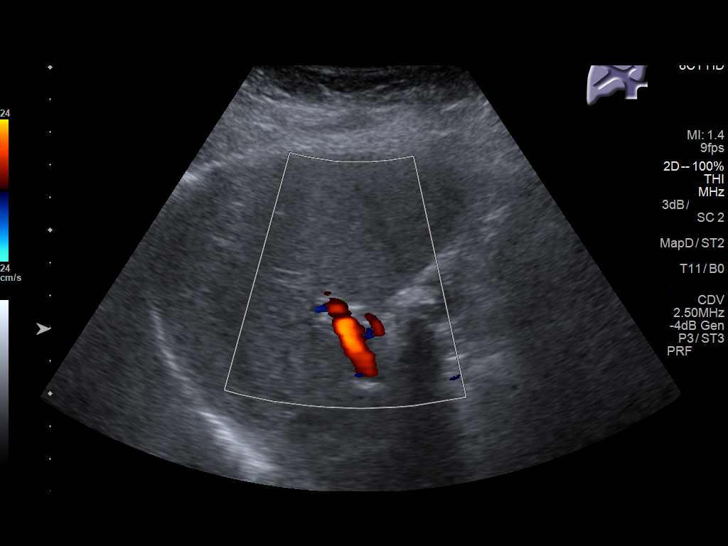
[im 41/50]
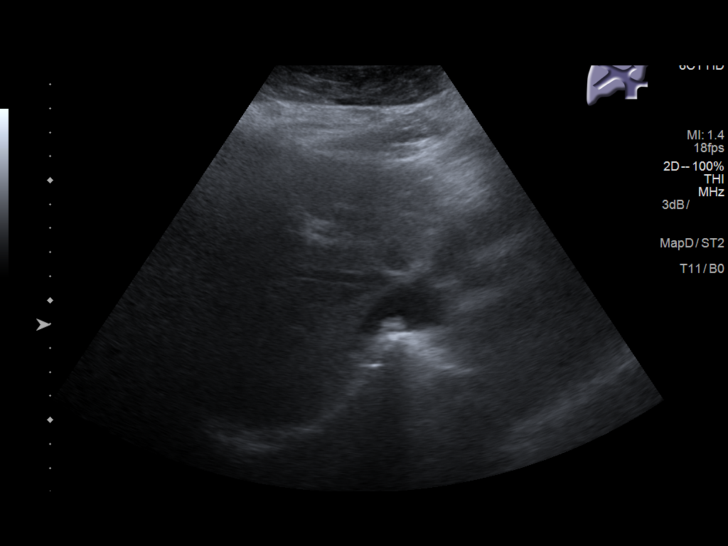
[im 45/50]
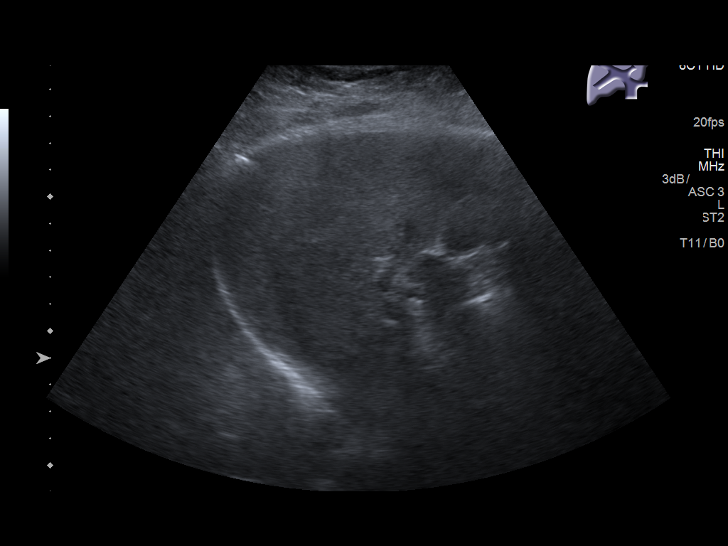
[im 50/50]
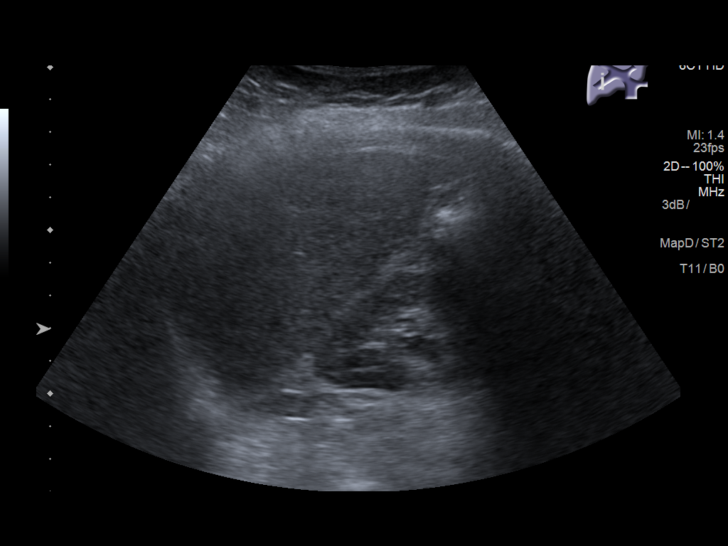

[14 of 25 positions shown; findings below may reference images not displayed]

FINDINGS: Gallbladder:

19 mm shadowing gallstone in the lower body or neck of the
gallbladder corresponds to the calcified stone seen by CT today.
This appears nonmobile gallbladder wall thickness is at the upper
limits of normal. No pericholecystic fluid. No sonographic [name]
sign elicited.

Common bile duct:

Diameter: 4 mm , normal

Liver:

No focal lesion identified. Within normal limits in parenchymal
echogenicity.

Other findings: Negative visible right kidney.
IMPRESSION: 1. Large round 19 mm stone within the gallbladder does appear
non-mobile, but there is no other strong sonographic evidence of
acute cholecystitis.
2. No evidence of biliary obstruction.

## 2018-06-27 IMAGING — CR DG LUMBAR SPINE 2-3V
1 series · 3 of 3 positions shown · non-contrast
Comparison: None.

CLINICAL DATA: [AGE] who fell 1 week ago and complains of low
back pain and abdominal pain. Initial encounter.

EXAM:
LUMBAR SPINE - 2-3 VIEW

[Series 1: dg lumbar spine 2-3 views · 0.14mm/px · 3 of 3 slices shown]
[im 1/3]
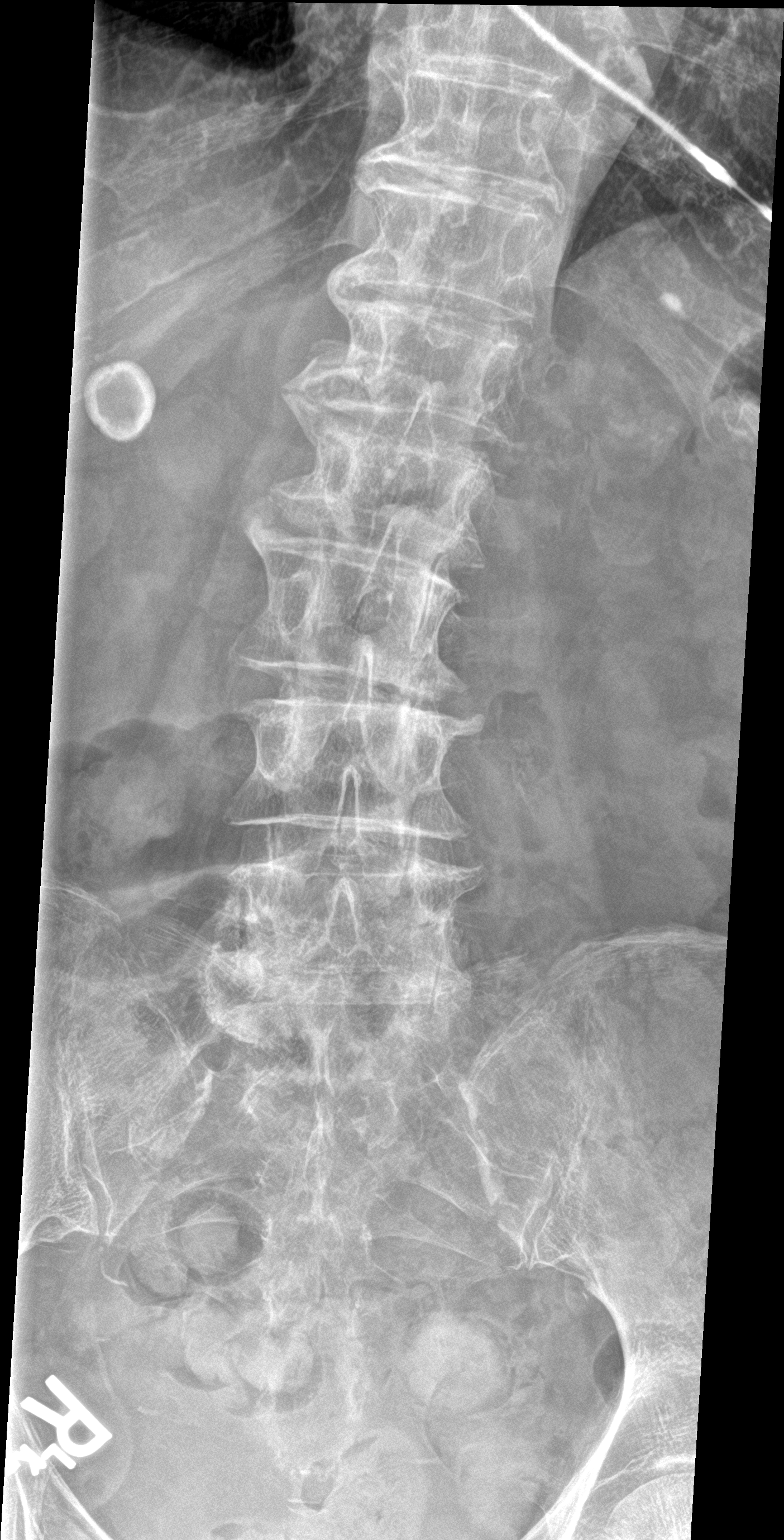
[im 2/3]
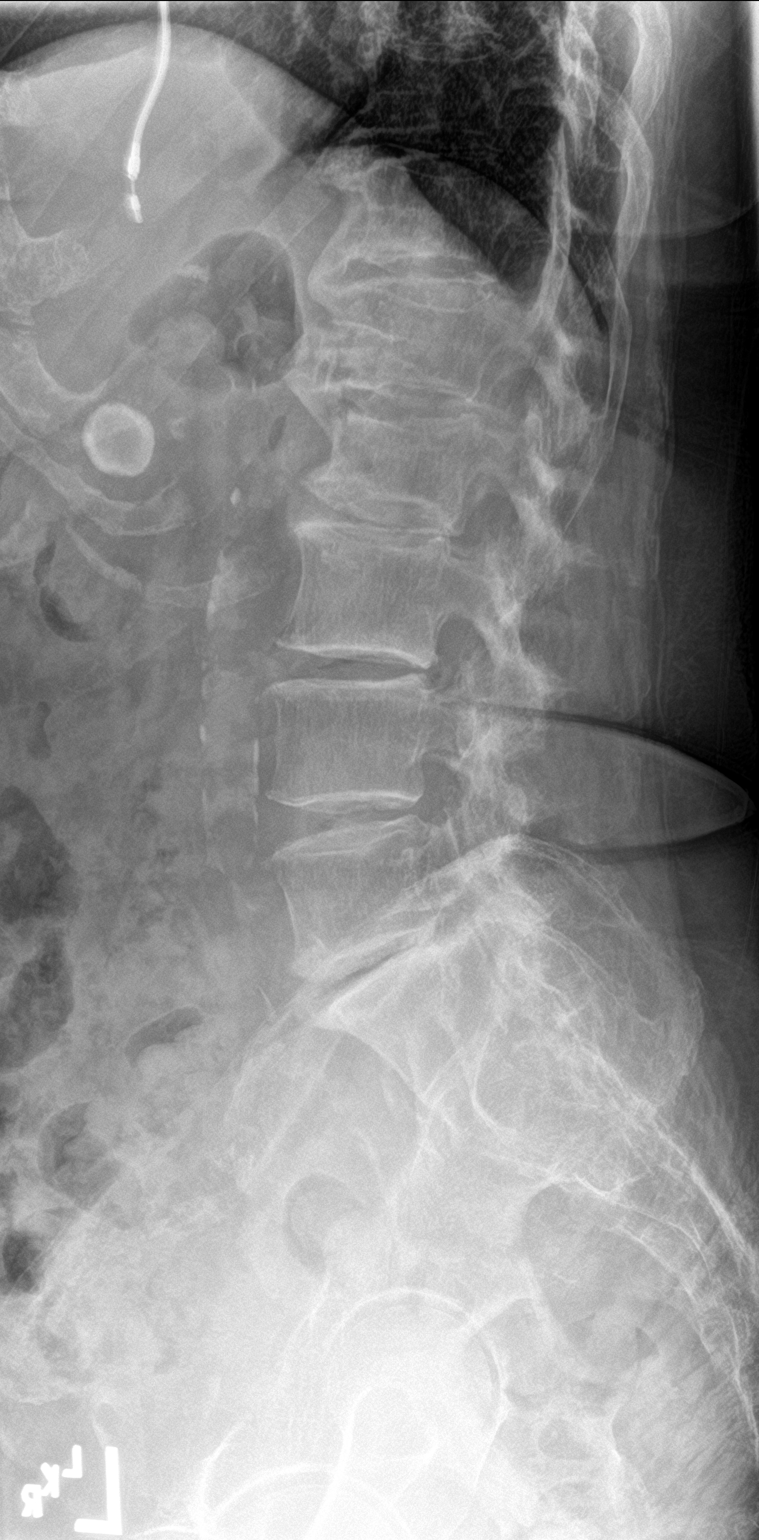
[im 3/3]
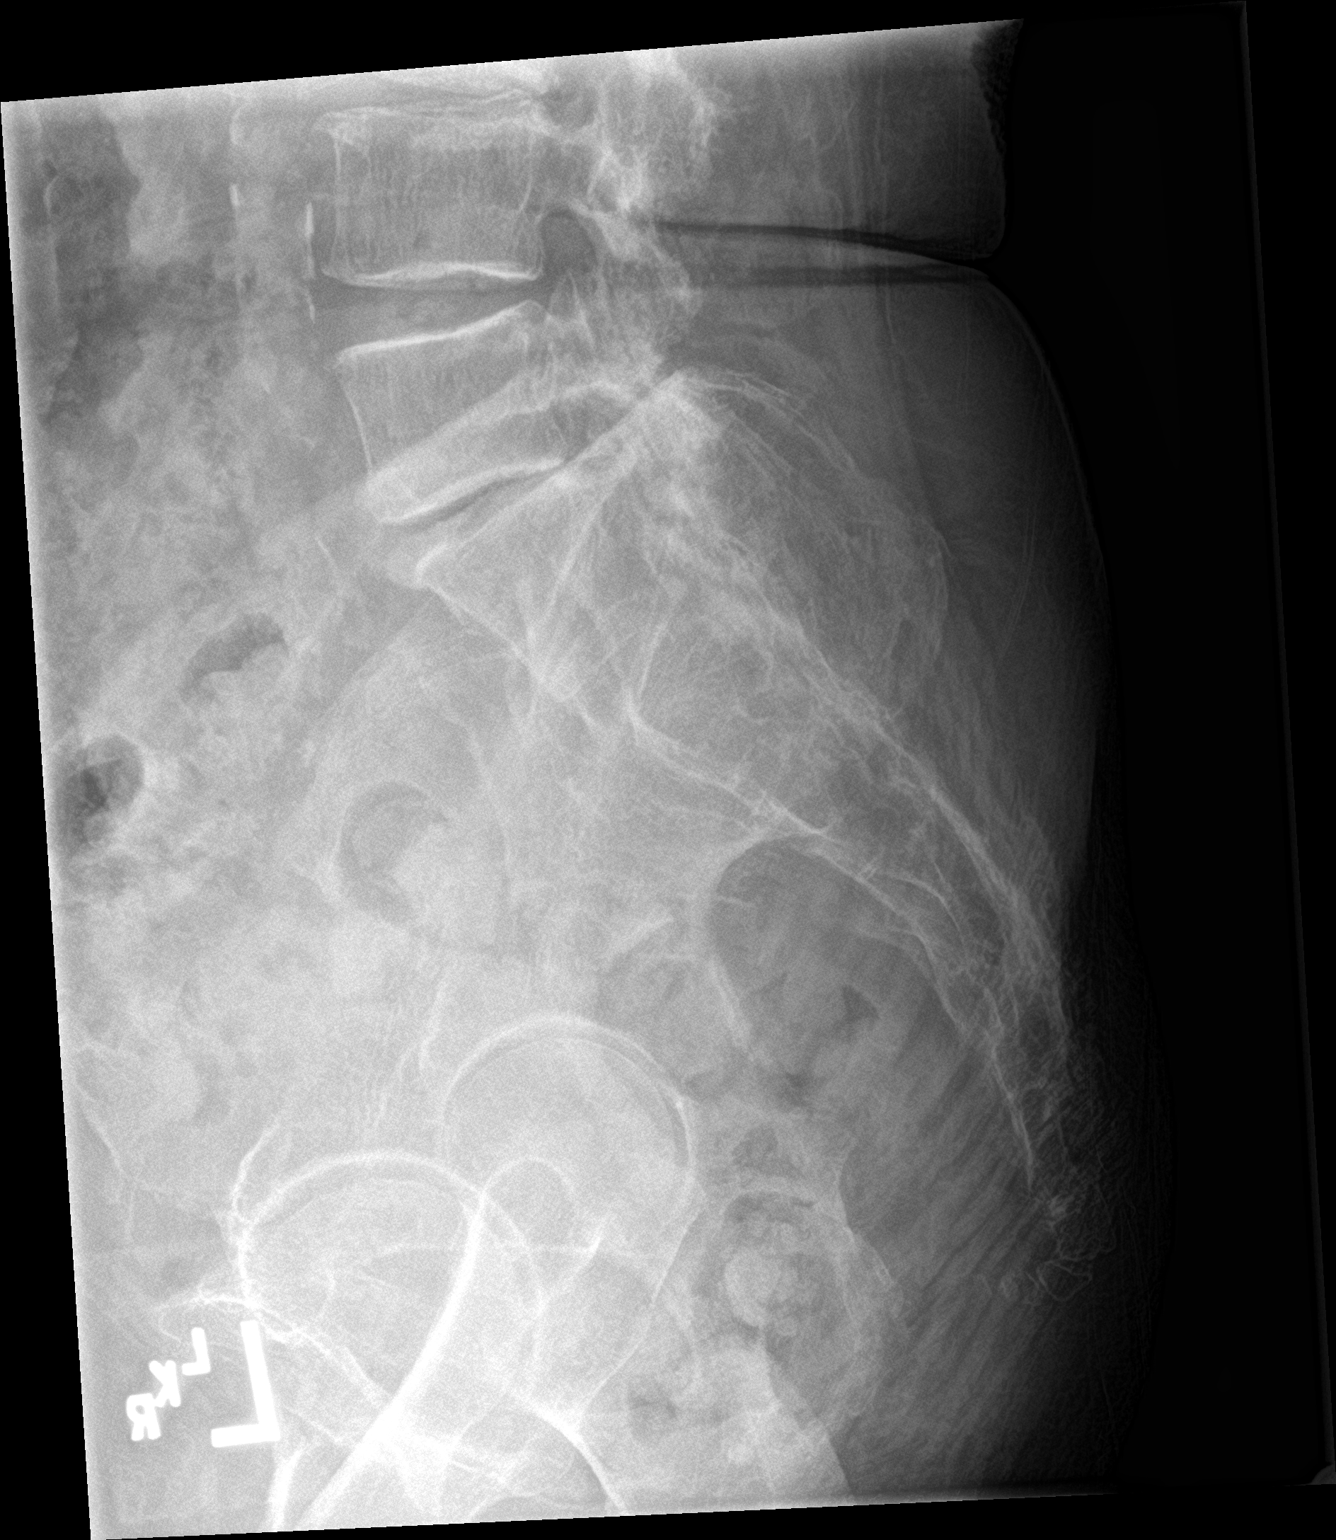

[3 of 3 positions shown; findings below may reference images not displayed]

FINDINGS: Five non-rib-bearing lumbar vertebrae with anatomic alignment. Mild
compression fracture involving the upper endplate of L2 on the order
of 15-20% or so. Disc space narrowing at every lumbar level, worst
at L5-S1. Bridging anterior and right lateral osteophytes at T11-12,
T12-L1 and L1-2, likely buttressing related to the thoracolumbar
dextroscoliosis. Mild osseous demineralization. Facet degenerative
changes at L4-5 and L5-S1.

Aortoiliac atherosclerosis without evidence of aneurysm. Calcified
gallstone in the right upper quadrant abdomen. These findings are
described on the report of the concurrent abdominal x-ray.
IMPRESSION: 1. Age indeterminate mild compression fracture involving the upper
endplate of L2 on the order of 15-20% or so. Correlation with point
tenderness is recommended in order to assist in determining whether
this is acute/subacute.
2. No fractures elsewhere.
3. Multilevel degenerative disc disease, worst at L5-S1.
4. Facet degenerative changes at L4-5 and L5-S1.

## 2018-07-02 IMAGING — CR DG ABDOMEN 1V
2 series · 2 of 2 positions shown · non-contrast
Comparison: None.

CLINICAL DATA: No bowel movement for 8 days.

EXAM:
ABDOMEN - 1 VIEW

[abdomen kub (1 of 2)]
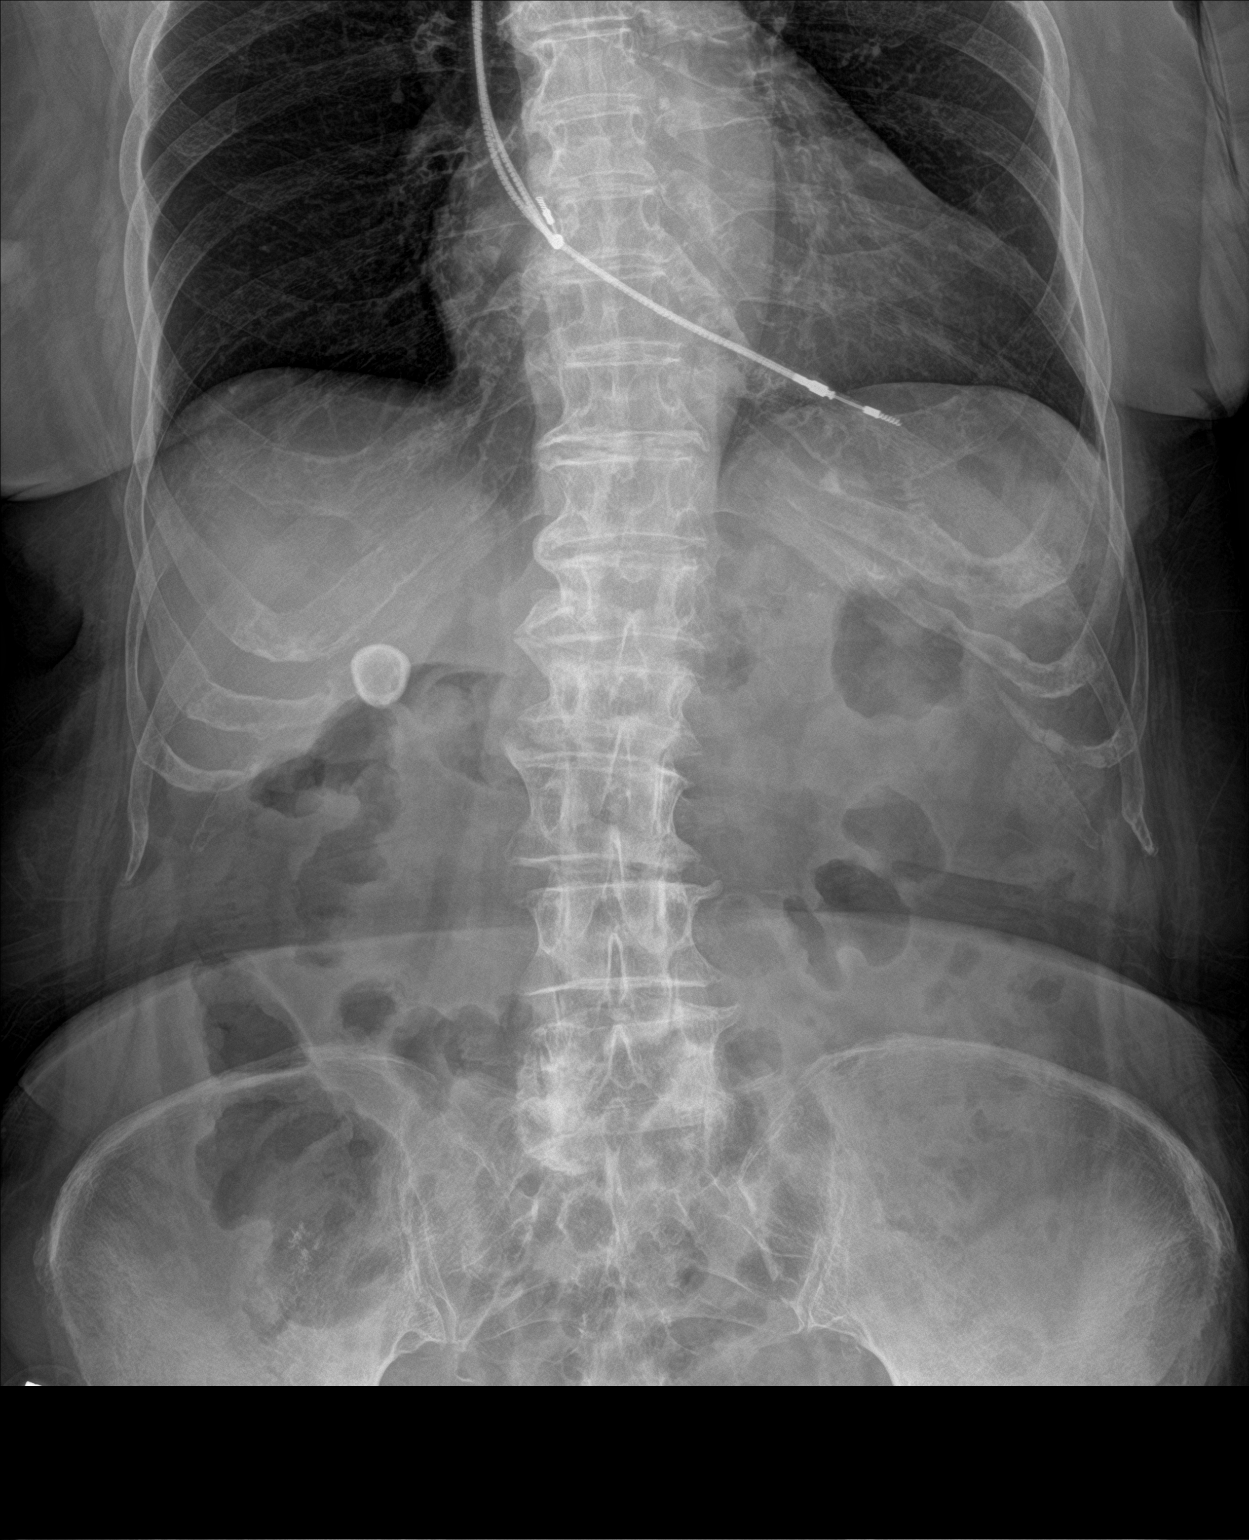

[abdomen kub (2 of 2)]
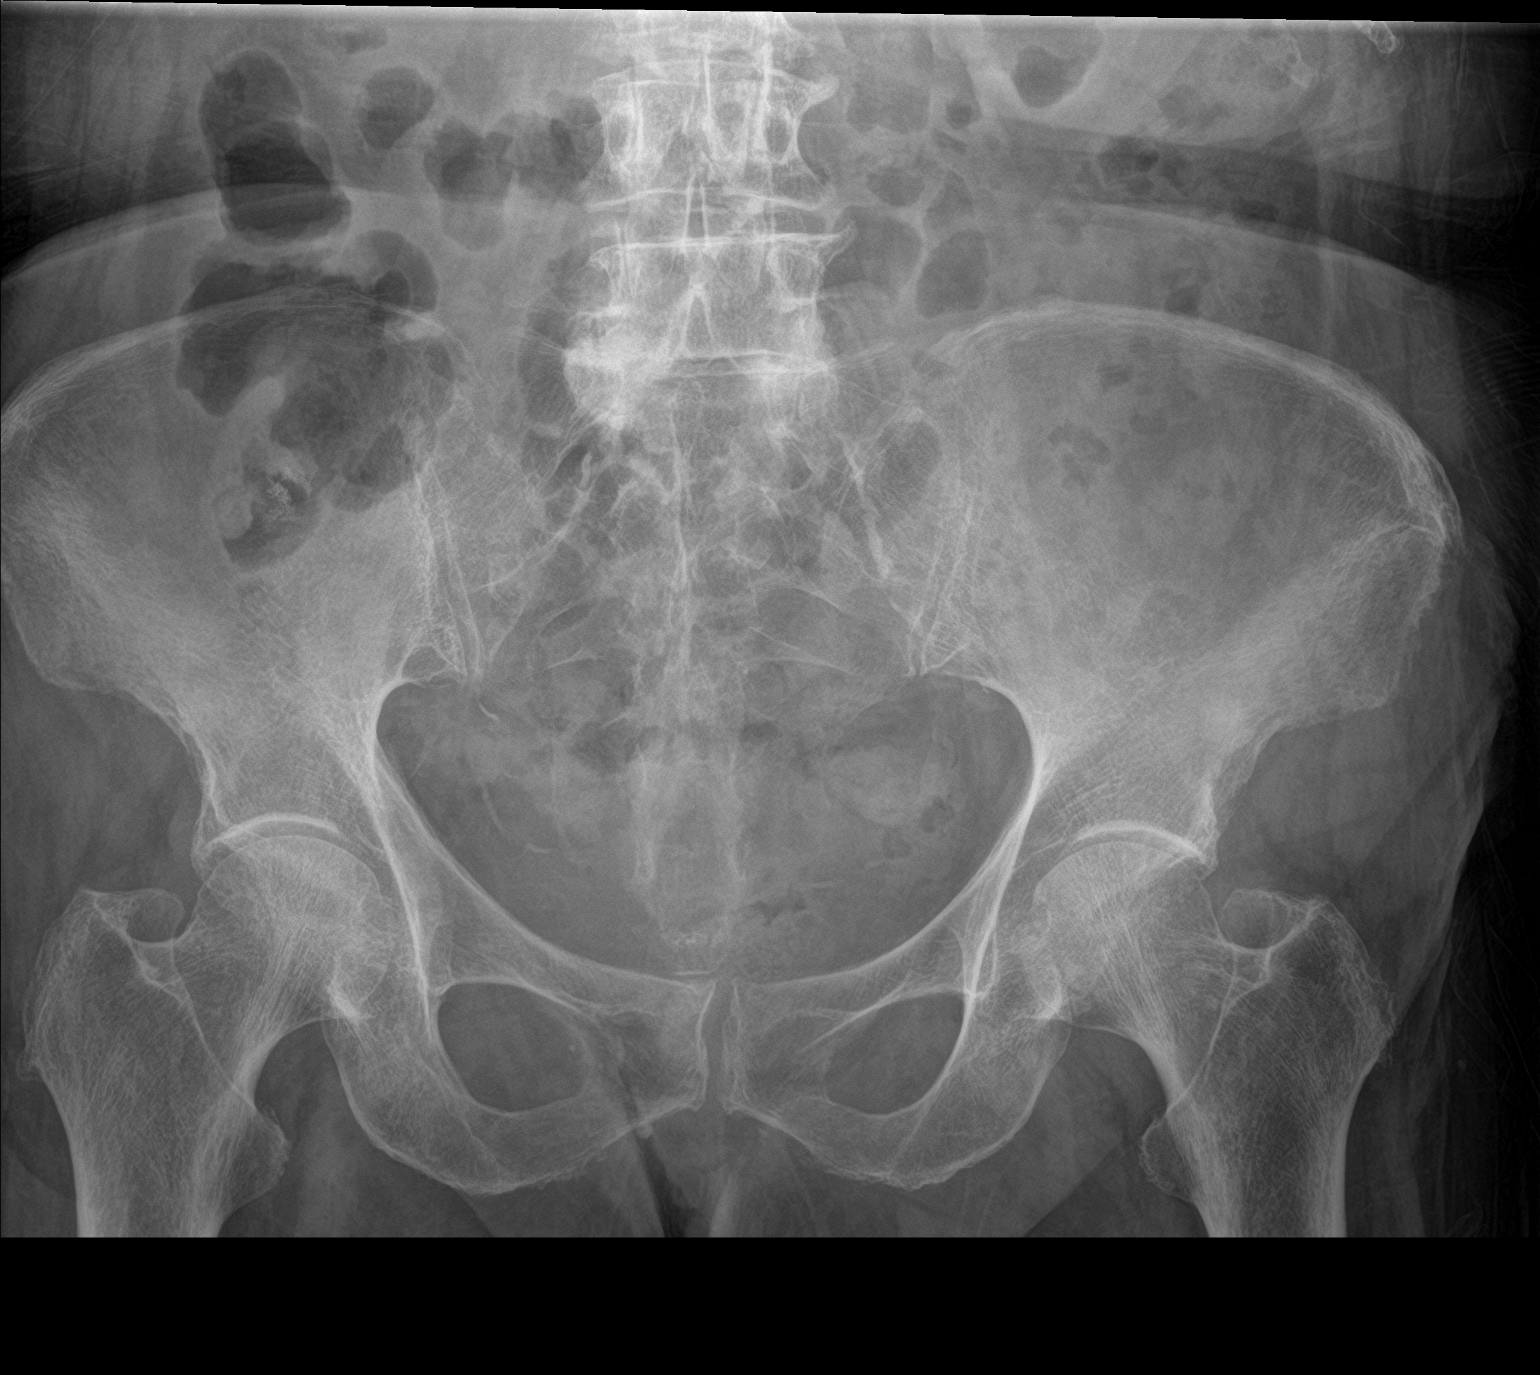

[2 of 2 positions shown; findings below may reference images not displayed]

FINDINGS: Normal bowel gas pattern. No abnormal stool retention or rectal
impaction. Lamellated stone over the right upper quadrant consistent
with cholelithiasis. There is stippled calcification over the right
iliac fossa which is nonspecific, appendicolith not excluded. No
concerning mass effect or gas collection. Lung bases are clear.
Cardiomegaly with dual-chamber pacer leads.
IMPRESSION: 1. Normal bowel gas pattern. No stool retention to correlate with
the history.
2. Right upper quadrant calcification consistent with gallstone.
Nonspecific right lower quadrant calcifications.

## 2019-11-20 ENCOUNTER — Emergency Department: Payer: Medicare Other

## 2019-11-20 ENCOUNTER — Encounter: Payer: Self-pay | Admitting: Intensive Care

## 2019-11-20 ENCOUNTER — Other Ambulatory Visit: Payer: Self-pay

## 2019-11-20 ENCOUNTER — Inpatient Hospital Stay
Admission: EM | Admit: 2019-11-20 | Discharge: 2019-11-25 | DRG: 177 | Disposition: A | Discharge status: Home / Self Care | Payer: Medicare Other | Attending: Internal Medicine | Admitting: Internal Medicine

## 2019-11-20 DIAGNOSIS — Z8249 Family history of ischemic heart disease and other diseases of the circulatory system: Secondary | ICD-10-CM

## 2019-11-20 DIAGNOSIS — Z7952 Long term (current) use of systemic steroids: Secondary | ICD-10-CM

## 2019-11-20 DIAGNOSIS — Z823 Family history of stroke: Secondary | ICD-10-CM

## 2019-11-20 DIAGNOSIS — I10 Essential (primary) hypertension: Secondary | ICD-10-CM | POA: Diagnosis present

## 2019-11-20 DIAGNOSIS — J189 Pneumonia, unspecified organism: Secondary | ICD-10-CM

## 2019-11-20 DIAGNOSIS — Z66 Do not resuscitate: Secondary | ICD-10-CM | POA: Diagnosis not present

## 2019-11-20 DIAGNOSIS — Z950 Presence of cardiac pacemaker: Secondary | ICD-10-CM | POA: Diagnosis not present

## 2019-11-20 DIAGNOSIS — Z79899 Other long term (current) drug therapy: Secondary | ICD-10-CM

## 2019-11-20 DIAGNOSIS — J9601 Acute respiratory failure with hypoxia: Secondary | ICD-10-CM | POA: Diagnosis present

## 2019-11-20 DIAGNOSIS — Z885 Allergy status to narcotic agent status: Secondary | ICD-10-CM

## 2019-11-20 DIAGNOSIS — Z7982 Long term (current) use of aspirin: Secondary | ICD-10-CM | POA: Diagnosis not present

## 2019-11-20 DIAGNOSIS — R0602 Shortness of breath: Secondary | ICD-10-CM | POA: Diagnosis present

## 2019-11-20 DIAGNOSIS — I2510 Atherosclerotic heart disease of native coronary artery without angina pectoris: Secondary | ICD-10-CM | POA: Diagnosis present

## 2019-11-20 DIAGNOSIS — Z8673 Personal history of transient ischemic attack (TIA), and cerebral infarction without residual deficits: Secondary | ICD-10-CM

## 2019-11-20 DIAGNOSIS — J690 Pneumonitis due to inhalation of food and vomit: Principal | ICD-10-CM | POA: Diagnosis present

## 2019-11-20 DIAGNOSIS — N179 Acute kidney failure, unspecified: Secondary | ICD-10-CM | POA: Diagnosis present

## 2019-11-20 DIAGNOSIS — D72829 Elevated white blood cell count, unspecified: Secondary | ICD-10-CM | POA: Diagnosis present

## 2019-11-20 DIAGNOSIS — Z20822 Contact with and (suspected) exposure to covid-19: Secondary | ICD-10-CM | POA: Diagnosis present

## 2019-11-20 DIAGNOSIS — J984 Other disorders of lung: Secondary | ICD-10-CM

## 2019-11-20 DIAGNOSIS — R0902 Hypoxemia: Secondary | ICD-10-CM | POA: Diagnosis present

## 2019-11-20 HISTORY — DX: Cerebral infarction, unspecified: I63.9

## 2019-11-20 LAB — URINALYSIS, COMPLETE (UACMP) WITH MICROSCOPIC
Bilirubin Urine: NEGATIVE
Glucose, UA: NEGATIVE mg/dL
Ketones, ur: 5 mg/dL — AB
Nitrite: NEGATIVE
Protein, ur: 100 mg/dL — AB
Specific Gravity, Urine: 1.012 (ref 1.005–1.030)
pH: 6 (ref 5.0–8.0)

## 2019-11-20 LAB — CBC WITH DIFFERENTIAL/PLATELET
Abs Immature Granulocytes: 0.11 10*3/uL — ABNORMAL HIGH (ref 0.00–0.07)
Basophils Absolute: 0.1 10*3/uL (ref 0.0–0.1)
Basophils Relative: 0 %
Eosinophils Absolute: 0 10*3/uL (ref 0.0–0.5)
Eosinophils Relative: 0 %
HCT: 47.8 % — ABNORMAL HIGH (ref 36.0–46.0)
Hemoglobin: 16.4 g/dL — ABNORMAL HIGH (ref 12.0–15.0)
Immature Granulocytes: 1 %
Lymphocytes Relative: 10 %
Lymphs Abs: 2.2 10*3/uL (ref 0.7–4.0)
MCH: 32.8 pg (ref 26.0–34.0)
MCHC: 34.3 g/dL (ref 30.0–36.0)
MCV: 95.6 fL (ref 80.0–100.0)
Monocytes Absolute: 0.7 10*3/uL (ref 0.1–1.0)
Monocytes Relative: 3 %
Neutro Abs: 19.2 10*3/uL — ABNORMAL HIGH (ref 1.7–7.7)
Neutrophils Relative %: 86 %
Platelets: 314 10*3/uL (ref 150–400)
RBC: 5 MIL/uL (ref 3.87–5.11)
RDW: 12.9 % (ref 11.5–15.5)
WBC: 22.3 10*3/uL — ABNORMAL HIGH (ref 4.0–10.5)
nRBC: 0 % (ref 0.0–0.2)

## 2019-11-20 LAB — COMPREHENSIVE METABOLIC PANEL
ALT: 27 U/L (ref 0–44)
AST: 35 U/L (ref 15–41)
Albumin: 4.3 g/dL (ref 3.5–5.0)
Alkaline Phosphatase: 98 U/L (ref 38–126)
Anion gap: 10 (ref 5–15)
BUN: 19 mg/dL (ref 8–23)
CO2: 25 mmol/L (ref 22–32)
Calcium: 9.5 mg/dL (ref 8.9–10.3)
Chloride: 100 mmol/L (ref 98–111)
Creatinine, Ser: 0.84 mg/dL (ref 0.44–1.00)
GFR calc Af Amer: >60 mL/min (ref >60)
GFR calc non Af Amer: 59 mL/min — ABNORMAL LOW (ref >60)
Glucose, Bld: 146 mg/dL — ABNORMAL HIGH (ref 70–99)
Potassium: 3.9 mmol/L (ref 3.5–5.1)
Sodium: 135 mmol/L (ref 135–145)
Total Bilirubin: 0.9 mg/dL (ref 0.3–1.2)
Total Protein: 8.5 g/dL — ABNORMAL HIGH (ref 6.5–8.1)

## 2019-11-20 LAB — RESPIRATORY PANEL BY RT PCR (FLU A&B, COVID)
Influenza A by PCR: NEGATIVE
Influenza B by PCR: NEGATIVE
SARS Coronavirus 2 by RT PCR: NEGATIVE

## 2019-11-20 LAB — FIBRIN DERIVATIVES D-DIMER (ARMC ONLY): Fibrin derivatives D-dimer (ARMC): 3807.42 ng/mL (FEU) — ABNORMAL HIGH (ref 0.00–499.00)

## 2019-11-20 LAB — POC SARS CORONAVIRUS 2 AG: SARS Coronavirus 2 Ag: NEGATIVE

## 2019-11-20 LAB — PROCALCITONIN: Procalcitonin: <0.1 ng/mL

## 2019-11-20 MED ORDER — DEXTROSE 5 % IV SOLN: 250.0000 mg | INTRAVENOUS | Status: DC

## 2019-11-20 MED ORDER — HYDRALAZINE HCL 10 MG PO TABS
10.0000 mg | ORAL_TABLET | Freq: Three times a day (TID) | ORAL | Status: DC | PRN
  Filled 2019-11-20: qty 1

## 2019-11-20 MED ORDER — SODIUM CHLORIDE 0.9 % IV SOLN
500.0000 mg | INTRAVENOUS | Status: DC
  Administered 2019-11-20 – 2019-11-21 (×2): 500 mg via INTRAVENOUS
  Filled 2019-11-20 (×3): qty 500

## 2019-11-20 MED ORDER — ALPRAZOLAM 0.25 MG PO TABS
0.2500 mg | ORAL_TABLET | Freq: Every day | ORAL | Status: DC
  Administered 2019-11-20 – 2019-11-24 (×5): 0.25 mg via ORAL
  Filled 2019-11-20 (×5): qty 1

## 2019-11-20 MED ORDER — ASPIRIN EC 81 MG PO TBEC
81.0000 mg | DELAYED_RELEASE_TABLET | Freq: Every day | ORAL | Status: DC
  Administered 2019-11-21 – 2019-11-25 (×5): 81 mg via ORAL
  Filled 2019-11-20 (×5): qty 1

## 2019-11-20 MED ORDER — LISINOPRIL 20 MG PO TABS
20.0000 mg | ORAL_TABLET | Freq: Every day | ORAL | Status: DC
  Administered 2019-11-21 – 2019-11-22 (×2): 20 mg via ORAL
  Filled 2019-11-20 (×2): qty 1

## 2019-11-20 MED ORDER — HEPARIN SODIUM (PORCINE) 5000 UNIT/ML IJ SOLN
5000.0000 [IU] | Freq: Three times a day (TID) | INTRAMUSCULAR | Status: DC
  Administered 2019-11-20 – 2019-11-24 (×13): 5000 [IU] via SUBCUTANEOUS
  Filled 2019-11-20 (×13): qty 1

## 2019-11-20 MED ORDER — LATANOPROST 0.005 % OP SOLN
1.0000 [drp] | Freq: Every day | OPHTHALMIC | Status: DC
  Administered 2019-11-20 – 2019-11-24 (×5): 1 [drp] via OPHTHALMIC
  Filled 2019-11-20: qty 2.5

## 2019-11-20 MED ORDER — ALPRAZOLAM 0.25 MG PO TABS
0.1250 mg | ORAL_TABLET | Freq: Every morning | ORAL | Status: DC
  Administered 2019-11-21 – 2019-11-25 (×5): 0.125 mg via ORAL
  Filled 2019-11-20 (×5): qty 1

## 2019-11-20 MED ORDER — AMLODIPINE BESYLATE 5 MG PO TABS
5.0000 mg | ORAL_TABLET | Freq: Every day | ORAL | Status: DC
  Administered 2019-11-21 – 2019-11-25 (×5): 5 mg via ORAL
  Filled 2019-11-20 (×5): qty 1

## 2019-11-20 MED ORDER — DOCUSATE SODIUM 100 MG PO CAPS
100.0000 mg | ORAL_CAPSULE | Freq: Every day | ORAL | Status: DC
  Administered 2019-11-21 – 2019-11-25 (×4): 100 mg via ORAL
  Filled 2019-11-20 (×4): qty 1

## 2019-11-20 MED ORDER — TIMOLOL MALEATE 0.5 % OP SOLN
1.0000 [drp] | Freq: Every day | OPHTHALMIC | Status: DC
  Administered 2019-11-21 – 2019-11-25 (×5): 1 [drp] via OPHTHALMIC
  Filled 2019-11-20: qty 5

## 2019-11-20 MED ORDER — SODIUM CHLORIDE 0.9 % IV SOLN
2.0000 g | INTRAVENOUS | Status: DC
  Administered 2019-11-20: 16:00:00 2 g via INTRAVENOUS
  Filled 2019-11-20: qty 20

## 2019-11-20 MED ORDER — SODIUM CHLORIDE 0.9 % IV SOLN
1.0000 g | INTRAVENOUS | Status: DC
  Filled 2019-11-20: qty 10

## 2019-11-20 MED ORDER — FLUTICASONE PROPIONATE 50 MCG/ACT NA SUSP
2.0000 | Freq: Every day | NASAL | Status: DC
  Administered 2019-11-21 – 2019-11-25 (×5): 2 via NASAL
  Filled 2019-11-20: qty 16

## 2019-11-20 NOTE — ED Provider Notes (Signed)
[facility] Emergency Department Provider Note  ____________________________________________  Time seen: Approximately 3:24 PM  I have reviewed the triage vital signs and the nursing notes.   HISTORY  Chief Complaint Shortness of Breath    Level 5 Caveat: Portions of the History and Physical including HPI and review of systems are unable to be completely obtained due to patient being a poor historian   HPI Brooke Potts is a 86 y.o. female with a history of CAD hypertension pacemaker stroke who comes the ED complaining of shortness of breath weakness sore throat and nonproductive cough for the past few days.  Constant, gradually worsening, no aggravating or alleviating factors.  No fever or chills or body aches.      Past Medical History:  Diagnosis Date  . Coronary artery disease   . Dysrhythmia   . Hypertension   . Pacemaker   . Stroke [facility])      Patient Active Problem List   Diagnosis Date Noted  . GI bleed 12/05/2016  . Chronic vertigo 05/04/2015  . History of CVA (cerebrovascular accident) 11/01/2014  . Hypercholesterolemia 11/01/2014  . Sick sinus syndrome (HCC) 11/01/2014  . HTN (hypertension) 04/28/2014  . Pacemaker 04/28/2014  . TIA (transient ischemic attack) 04/28/2014  . Atrial fibrillation (HCC) 02/11/2014     Past Surgical History:  Procedure Laterality Date  . COLONOSCOPY WITH PROPOFOL N/A 12/11/2016   Procedure: COLONOSCOPY WITH PROPOFOL;  Surgeon: Wyline Mood, MD;  Location: [facility] ENDOSCOPY;  Service: Gastroenterology;  Laterality: N/A;  . ESOPHAGOGASTRODUODENOSCOPY N/A 12/09/2016   Procedure: ESOPHAGOGASTRODUODENOSCOPY (EGD);  Surgeon: Toney Reil, MD;  Location: [facility] ENDOSCOPY;  Service: Gastroenterology;  Laterality: N/A;  . PACEMAKER INSERTION       Prior to Admission medications   Medication Sig Start Date End Date Taking? Authorizing Provider  acetaminophen (TYLENOL) 325 MG tablet Take 650 mg by mouth  every 6 (six) hours as needed.    [provider]  ALPRAZolam Prudy Feeler) 0.25 MG tablet Take 0.25 mg by mouth 3 (three) times daily as needed. 10/13/16   [provider]  aspirin EC 81 MG tablet Take 1 tablet (81 mg total) by mouth daily. 12/11/16   Milagros Loll, MD  Cranberry 450 MG TABS Take 450 mg by mouth 2 (two) times daily.    [provider]  ferrous sulfate 325 (65 FE) MG tablet Take 1 tablet (325 mg total) by mouth 2 (two) times daily with a meal. 12/11/16   Sudini, Srikar, MD  latanoprost (XALATAN) 0.005 % ophthalmic solution Place 1 drop into both eyes at bedtime. 09/19/14   [provider]  lisinopril-hydrochlorothiazide (PRINZIDE,ZESTORETIC) 20-25 MG tablet Take 1 tablet by mouth daily. 11/22/16   [provider]  potassium chloride (K-DUR) 10 MEQ tablet Take 2 tablets (20 mEq total) by mouth daily. 05/15/17   Jeanmarie Plant, MD  predniSONE (DELTASONE) 20 MG tablet  10/02/16   [provider]  simvastatin (ZOCOR) 20 MG tablet  10/12/16   [provider]  traMADol (ULTRAM) 50 MG tablet Take 1 tablet (50 mg total) by mouth every 6 (six) hours as needed. 05/10/17   Minna Antis, MD  vitamin B-12 1000 MCG tablet Take 1 tablet (1,000 mcg total) by mouth daily. 12/11/16   Milagros Loll, MD     Allergies Tramadol   Family History  Problem Relation Age of Onset  . Breast cancer Mother 68  . Peripheral vascular disease Mother   . Diabetes Mother   .  Stroke Father   . Diabetes Father   . Diabetes Sister   . Diabetes Brother     Social History Social History   Tobacco Use  . Smoking status: Never Smoker  . Smokeless tobacco: Never Used  Substance Use Topics  . Alcohol use: No  . Drug use: No    Review of Systems Level 5 Caveat: Portions of the History and Physical including HPI and review of systems are unable to be completely obtained due to patient being a poor historian   Constitutional:   No known fever.   ENT:   No rhinorrhea.  Positive sore throat Cardiovascular:   No chest pain or syncope. Respiratory:   Positive shortness of breath and cough. Gastrointestinal:   Negative for abdominal pain, vomiting and diarrhea.  Musculoskeletal:   Negative for focal pain or swelling ____________________________________________   PHYSICAL EXAM:  VITAL SIGNS: ED Triage Vitals  Enc Vitals Group     BP 11/20/19 1316 (!) 161/63     Pulse Rate 11/20/19 1316 93     Resp 11/20/19 1316 (!) 22     Temp 11/20/19 1316 97.6 F (36.4 C)     Temp Source 11/20/19 1316 Oral     SpO2 11/20/19 1316 (!) 89 %     Weight 11/20/19 1313 120 lb (54.4 kg)     Height 11/20/19 1313 5' (1.524 m)     Head Circumference --      Peak Flow --      Pain Score 11/20/19 1310 10     Pain Loc --      Pain Edu? --      Excl. in [place]? --     Vital signs reviewed, nursing assessments reviewed.   Constitutional:   Alert and oriented.  Ill-appearing. Eyes:   Conjunctivae are normal. EOMI. PERRL. ENT      Head:   Normocephalic and atraumatic.      Nose:   No congestion/rhinnorhea.       Mouth/Throat:   MMM, no pharyngeal erythema. No peritonsillar mass.       Neck:   No meningismus. Full ROM.  No JVD Hematological/Lymphatic/Immunilogical:   No cervical lymphadenopathy. Cardiovascular:   RRR. Symmetric bilateral radial and DP pulses.  No murmurs. Cap refill less than 2 seconds. Respiratory: Slight tachypnea.  No accessory muscle use.  Diffuse crackles. Gastrointestinal:   Soft and nontender. Non distended. There is no CVA tenderness.  No rebound, rigidity, or guarding.  Musculoskeletal:   Normal range of motion in all extremities. No joint effusions.  No lower extremity tenderness.  No edema. Neurologic:   Normal speech and language.  Motor grossly intact. No acute focal neurologic deficits are appreciated.  Skin:    Skin is warm, dry and intact. No rash noted.  No petechiae, purpura, or  bullae.  ____________________________________________    LABS (pertinent positives/negatives) (all labs ordered are listed, but only abnormal results are displayed) Labs Reviewed  CBC WITH DIFFERENTIAL/PLATELET - Abnormal; Notable for the following components:      Result Value   WBC 22.3 (*)    Hemoglobin 16.4 (*)    HCT 47.8 (*)    Neutro Abs 19.2 (*)    Abs Immature Granulocytes 0.11 (*)    All other components within normal limits  COMPREHENSIVE METABOLIC PANEL - Abnormal; Notable for the following components:   Glucose, Bld 146 (*)    Total Protein 8.5 (*)    GFR calc non Af Amer 59 (*)  All other components within normal limits  RESPIRATORY PANEL BY RT PCR (FLU A&B, COVID)  PROCALCITONIN  POC SARS CORONAVIRUS 2 AG -  ED  POC SARS CORONAVIRUS 2 AG   ____________________________________________   EKG  Interpreted by me Paced rhythm, rate of 92.  Left axis, right bundle branch block.  Normal ST segments.  Isolated T wave inversion in aVL which is nonspecific.  ____________________________________________    RADIOLOGY  DG Chest Port 1 View  Result Date: 11/20/2019 CLINICAL DATA:  Shortness of breath and cough. EXAM: PORTABLE CHEST 1 VIEW COMPARISON:  04/09/2013 FINDINGS: The heart size and pulmonary vascularity are normal considering the AP portable technique. Pacemaker in place. Aortic atherosclerosis. The lungs are clear. No effusions. No pneumothorax. No acute bone abnormality. IMPRESSION: No acute abnormality. Aortic Atherosclerosis (ICD10-I70.0). Electronically Signed   By: Francene Boyers M.D.   On: 11/20/2019 13:40    ____________________________________________   PROCEDURES .Critical Care Performed by: Sharman Cheek, MD Authorized by: Sharman Cheek, MD   Critical care provider statement:    Critical care time (minutes):  35   Critical care time was exclusive of:  Separately billable procedures and treating other patients   Critical care was  necessary to treat or prevent imminent or life-threatening deterioration of the following conditions:  Respiratory failure   Critical care was time spent personally by me on the following activities:  Development of treatment plan with patient or surrogate, discussions with consultants, evaluation of patient's response to treatment, examination of patient, obtaining history from patient or surrogate, ordering and performing treatments and interventions, ordering and review of laboratory studies, ordering and review of radiographic studies, pulse oximetry, re-evaluation of patient's condition and review of old charts    ____________________________________________  DIFFERENTIAL DIAGNOSIS   Pneumonia, COVID-19, pulmonary edema, pleural effusion  CLINICAL IMPRESSION / ASSESSMENT AND PLAN / ED COURSE  Medications ordered in the ED: Medications  cefTRIAXone (ROCEPHIN) 2 g in sodium chloride 0.9 % 100 mL IVPB (2 g Intravenous New Bag/Given 11/20/19 1531)  azithromycin (ZITHROMAX) 500 mg in sodium chloride 0.9 % 250 mL IVPB (has no administration in time range)    Pertinent labs & imaging results that were available during my care of the patient were reviewed by me and considered in my medical decision making (see chart for details).   Brooke Potts was evaluated in Emergency Department on 11/20/2019 for the symptoms described in the history of present illness. She was evaluated in the context of the global COVID-19 pandemic, which necessitated consideration that the patient might be at risk for infection with the SARS-CoV-2 virus that causes COVID-19. Institutional protocols and algorithms that pertain to the evaluation of patients at risk for COVID-19 are in a state of rapid change based on information released by regulatory bodies including the CDC and federal and state organizations. These policies and algorithms were followed during the patient's care in the ED.   Patient presents with  shortness of breath cough and generalized weakness, most concerning for COVID-19 versus possible pneumonia.  Doubt sepsis.  Requires 2 L nasal cannula due to hypoxic respiratory failure.  Initial chest x-ray unremarkable, point-of-care Covid test is negative but white count is 22,000 so antibiotics were started with ceftriaxone and azithromycin.  Given the patient's clinically apparent pulmonary congestion, I doubt PE and would defer on CT scan at this time.      ____________________________________________   FINAL CLINICAL IMPRESSION(S) / ED DIAGNOSES    Final diagnoses:  Acute respiratory failure with  hypoxia (HCC)  Community acquired pneumonia, unspecified laterality     ED Discharge Orders    None      Portions of this note were generated with dragon dictation software. Dictation errors may occur despite best attempts at proofreading.   Sharman Cheek, MD 11/20/19 1536

## 2019-11-20 NOTE — ED Triage Notes (Signed)
Patient presents with SOB, weakness, sore throat and cough. Gurgling noises heard easily standing near patient. Denies fever or diarrhea

## 2019-11-20 NOTE — H&P (Addendum)
[facility] History and Physical  Brooke Potts ZOX:[id number] DOB: 1923-03-26 DOA: 11/20/2019   PCP: Rafael Bihari, MD   Chief Complaint: SOB, cough, sore throat  HPI: Brooke Potts is a 86 y.o. female  History of CAD, hypertension, sick sinus syndrome status post pacemaker, CVA presented with cough, sore throat, SOB  Patient is not a good historian, per patient and chart review, 2 days, patient started to have sore throat, sob, states she did not cough much.   Denies fever, chills, runny nose, chest pain, palpitation, abdominal pain, nausea, vomiting, dysuria, leg swelling. Denies exposure to sick people.  In the ED, patient was found temperature 97.6, heart rate 93-1 1 2, respiratory rate 26, blood pressure 165/83, oxygen saturation 94%.  Labs shows glucose 146, otherwise unremarkable CMP.  Procalcitonin less than 0.1, WBC 22.3, hemoglobin 16.4, HCT 47.8, neutrophils 86%.  Covid rapid antigen negative.  Covid and flu test negative Chest x-ray: No acute abnormality.  EKG: Ventricular paced rhythm.  In the ED,  Patient was given azithromycin, ceftriaxone. hospitalist was requested to admit patient   Review of Systems:  All others reviewed and are negative, except stated in HPI  Past Medical History:  Diagnosis Date  . Coronary artery disease   . Dysrhythmia   . Hypertension   . Pacemaker   . Stroke [facility])    Past Surgical History:  Procedure Laterality Date  . COLONOSCOPY WITH PROPOFOL N/A 12/11/2016   Procedure: COLONOSCOPY WITH PROPOFOL;  Surgeon: Wyline Mood, MD;  Location: [facility] ENDOSCOPY;  Service: Gastroenterology;  Laterality: N/A;  . ESOPHAGOGASTRODUODENOSCOPY N/A 12/09/2016   Procedure: ESOPHAGOGASTRODUODENOSCOPY (EGD);  Surgeon: Toney Reil, MD;  Location[facility] ENDOSCOPY;  Service: Gastroenterology;  Laterality: N/A;  . PACEMAKER INSERTION     Social History:  reports that she has never smoked. She has never used smokeless tobacco. She  reports that she does not drink alcohol or use drugs.  Allergies  Allergen Reactions  . Tramadol Nausea And Vomiting and Nausea Only    Family History  Problem Relation Age of Onset  . Breast cancer Mother 54  . Peripheral vascular disease Mother   . Diabetes Mother   . Stroke Father   . Diabetes Father   . Diabetes Sister   . Diabetes Brother     Prior to Admission medications   Medication Sig Start Date End Date Taking? Authorizing Provider  acetaminophen (TYLENOL) 325 MG tablet Take 650 mg by mouth every 6 (six) hours as needed.    [provider]  ALPRAZolam Prudy Feeler) 0.25 MG tablet Take 0.25 mg by mouth 3 (three) times daily as needed. 10/13/16   [provider]  aspirin EC 81 MG tablet Take 1 tablet (81 mg total) by mouth daily. 12/11/16   Milagros Loll, MD  Cranberry 450 MG TABS Take 450 mg by mouth 2 (two) times daily.    [provider]  ferrous sulfate 325 (65 FE) MG tablet Take 1 tablet (325 mg total) by mouth 2 (two) times daily with a meal. 12/11/16   Sudini, Srikar, MD  latanoprost (XALATAN) 0.005 % ophthalmic solution Place 1 drop into both eyes at bedtime. 09/19/14   [provider]  lisinopril-hydrochlorothiazide (PRINZIDE,ZESTORETIC) 20-25 MG tablet Take 1 tablet by mouth daily. 11/22/16   [provider]  potassium chloride (K-DUR) 10 MEQ tablet Take 2 tablets (20 mEq total) by mouth daily. 05/15/17   Jeanmarie Plant, MD  predniSONE (DELTASONE) 20 MG tablet  10/02/16  [provider]  simvastatin (ZOCOR) 20 MG tablet  10/12/16   [provider]  traMADol (ULTRAM) 50 MG tablet Take 1 tablet (50 mg total) by mouth every 6 (six) hours as needed. 05/10/17   Harvest Dark, MD  vitamin B-12 1000 MCG tablet Take 1 tablet (1,000 mcg total) by mouth daily. 12/11/16   Hillary Bow, MD   Physical Exam: Vitals:   11/20/19 1313 11/20/19 1316 11/20/19 1532  BP:  (!) 161/63 (!) 165/83  Pulse:  93 (!) 112  Resp:   (!) 22 (!) 26  Temp:  97.6 F (36.4 C)   TempSrc:  Oral   SpO2:  (!) 89% 94%  Weight: 54.4 kg    Height: 5' (1.524 m)      Wt Readings from Last 3 Encounters:  11/20/19 54.4 kg  05/15/17 45.8 kg  05/10/17 54 kg    General:  Appears calm and comfortable Eyes: PERRL, normal lids, irises & conjunctiva ENT: grossly normal hearing, lips & tongue Neck: no LAD, masses or thyromegaly Cardiovascular: RRR, no m/r/g. No LE edema. Telemetry: SR, no arrhythmias  Respiratory: CTA bilaterally, no w/r/r. Normal respiratory effort. Abdomen: soft, ntnd Skin: no rash or induration seen on limited exam Musculoskeletal: grossly normal tone BUE/BLE Psychiatric: grossly normal mood and affect, speech fluent and appropriate Neurologic: grossly non-focal.          Labs on Admission:  Basic Metabolic Panel: Recent Labs  Lab 11/20/19 1359  NA 135  K 3.9  CL 100  CO2 25  GLUCOSE 146*  BUN 19  CREATININE 0.84  CALCIUM 9.5   Liver Function Tests: Recent Labs  Lab 11/20/19 1359  AST 35  ALT 27  ALKPHOS 98  BILITOT 0.9  PROT 8.5*  ALBUMIN 4.3   No results for input(s): LIPASE, AMYLASE in the last 168 hours. No results for input(s): AMMONIA in the last 168 hours. CBC: Recent Labs  Lab 11/20/19 1359  WBC 22.3*  NEUTROABS 19.2*  HGB 16.4*  HCT 47.8*  MCV 95.6  PLT 314   Cardiac Enzymes: No results for input(s): CKTOTAL, CKMB, CKMBINDEX, TROPONINI in the last 168 hours.  BNP (last 3 results) No results for input(s): BNP in the last 8760 hours.  ProBNP (last 3 results) No results for input(s): PROBNP in the last 8760 hours.  CBG: No results for input(s): GLUCAP in the last 168 hours.  Radiological Exams on Admission: DG Chest Port 1 View  Result Date: 11/20/2019 CLINICAL DATA:  Shortness of breath and cough. EXAM: PORTABLE CHEST 1 VIEW COMPARISON:  04/09/2013 FINDINGS: The heart size and pulmonary vascularity are normal considering the AP portable technique. Pacemaker in  place. Aortic atherosclerosis. The lungs are clear. No effusions. No pneumothorax. No acute bone abnormality. IMPRESSION: No acute abnormality. Aortic Atherosclerosis (ICD10-I70.0). Electronically Signed   By: Lorriane Shire M.D.   On: 11/20/2019 13:40    EKG: Independently reviewed.  ventricular paced rhythm  Assessment/Plan Principal Problem:   SOB (shortness of breath) Active Problems:   Leukocytosis   Pneumonia   Hypoxia    #SOB, cough, sore throat: Patient symptoms consistent URI, bronchitis However evolving pneumonia cannot rule out. Viruses versus bacteria Procalcitonin less than 0.1, Flu and Covid test negative Will check respiratory viral panel ED empirically give Rocephin and azithromycin, will continue given leukocytosis. Sputum culture pending. Sounds patient has a lot of secretion and have difficulty to cough, will initiate scheduled suction  #Hypoxia: Etiology likely secondary to above. Less likely differential diagnosis  including PE, CHF (no h/o CHF) Will get D-dimer given patient is low risk   #Leukocytosis: No history of leukocytosis. Etiology likely bronchitis/pneumonia, see above However other etiology cannot rule out.  UA pending although UTI unlikely given asymptomatic  #History of CAD, hypertension, sick sinus syndrome status post pacemaker, CVA: Established Active problems see above Continue home medications if appropriate follow-up with his PCP, specialist when necessary   Code Status: Full (must indicate code status--if unknown or must be presumed, indicate so) DVT Prophylaxis: heparin Family Communication: No family at bedside (indicate person spoken with, if applicable, with phone number if by telephone) Disposition Plan: in patient  (indicate anticipated LOS)  Time spent: 38  Brooke Potts [facility]

## 2019-11-21 ENCOUNTER — Encounter: Payer: Self-pay | Admitting: Internal Medicine

## 2019-11-21 ENCOUNTER — Inpatient Hospital Stay: Payer: Medicare Other

## 2019-11-21 LAB — CBC WITH DIFFERENTIAL/PLATELET
Abs Immature Granulocytes: 0.23 10*3/uL — ABNORMAL HIGH (ref 0.00–0.07)
Basophils Absolute: 0.1 10*3/uL (ref 0.0–0.1)
Basophils Relative: 0 %
Eosinophils Absolute: 0.1 10*3/uL (ref 0.0–0.5)
Eosinophils Relative: 0 %
HCT: 42.8 % (ref 36.0–46.0)
Hemoglobin: 14.6 g/dL (ref 12.0–15.0)
Immature Granulocytes: 1 %
Lymphocytes Relative: 10 %
Lymphs Abs: 2.9 10*3/uL (ref 0.7–4.0)
MCH: 32.7 pg (ref 26.0–34.0)
MCHC: 34.1 g/dL (ref 30.0–36.0)
MCV: 96 fL (ref 80.0–100.0)
Monocytes Absolute: 1.6 10*3/uL — ABNORMAL HIGH (ref 0.1–1.0)
Monocytes Relative: 6 %
Neutro Abs: 24.3 10*3/uL — ABNORMAL HIGH (ref 1.7–7.7)
Neutrophils Relative %: 83 %
Platelets: 265 10*3/uL (ref 150–400)
RBC: 4.46 MIL/uL (ref 3.87–5.11)
RDW: 13.2 % (ref 11.5–15.5)
Smear Review: NORMAL
WBC: 29.1 10*3/uL — ABNORMAL HIGH (ref 4.0–10.5)
nRBC: 0 % (ref 0.0–0.2)

## 2019-11-21 LAB — BASIC METABOLIC PANEL
Anion gap: 9 (ref 5–15)
BUN: 23 mg/dL (ref 8–23)
CO2: 26 mmol/L (ref 22–32)
Calcium: 8.8 mg/dL — ABNORMAL LOW (ref 8.9–10.3)
Chloride: 100 mmol/L (ref 98–111)
Creatinine, Ser: 1.02 mg/dL — ABNORMAL HIGH (ref 0.44–1.00)
GFR calc Af Amer: 55 mL/min — ABNORMAL LOW (ref >60)
GFR calc non Af Amer: 47 mL/min — ABNORMAL LOW (ref >60)
Glucose, Bld: 127 mg/dL — ABNORMAL HIGH (ref 70–99)
Potassium: 3.7 mmol/L (ref 3.5–5.1)
Sodium: 135 mmol/L (ref 135–145)

## 2019-11-21 MED ORDER — IOHEXOL 350 MG/ML SOLN: 75.0000 mL | Freq: Once | INTRAVENOUS | Status: DC | PRN

## 2019-11-21 MED ORDER — ORAL CARE MOUTH RINSE
15.0000 mL | Freq: Two times a day (BID) | OROMUCOSAL | Status: DC
  Administered 2019-11-21 – 2019-11-24 (×4): 15 mL via OROMUCOSAL

## 2019-11-21 MED ORDER — IOHEXOL 350 MG/ML SOLN
60.0000 mL | Freq: Once | INTRAVENOUS | Status: AC | PRN
  Administered 2019-11-21: 12:00:00 60 mL via INTRAVENOUS

## 2019-11-21 MED ORDER — PIPERACILLIN-TAZOBACTAM 3.375 G IVPB
3.3750 g | Freq: Three times a day (TID) | INTRAVENOUS | Status: DC
  Administered 2019-11-21 – 2019-11-22 (×3): 3.375 g via INTRAVENOUS
  Filled 2019-11-21 (×3): qty 50

## 2019-11-21 NOTE — Evaluation (Addendum)
Clinical/Bedside Swallow Evaluation Patient Details  Name: Brooke Potts MRN: [id number] Date of Birth: 11-20-1922  Today's Date: 11/21/2019 Time: SLP Start Time (ACUTE ONLY): 1515 SLP Stop Time (ACUTE ONLY): 1600 SLP Time Calculation (min) (ACUTE ONLY): 45 min  Past Medical History:  Past Medical History:  Diagnosis Date  . Coronary artery disease   . Dysrhythmia   . Hypertension   . Pacemaker   . Stroke [facility])    Past Surgical History:  Past Surgical History:  Procedure Laterality Date  . COLONOSCOPY WITH PROPOFOL N/A 12/11/2016   Procedure: COLONOSCOPY WITH PROPOFOL;  Surgeon: Wyline Mood, MD;  Location: [facility] ENDOSCOPY;  Service: Gastroenterology;  Laterality: N/A;  . ESOPHAGOGASTRODUODENOSCOPY N/A 12/09/2016   Procedure: ESOPHAGOGASTRODUODENOSCOPY (EGD);  Surgeon: Toney Reil, MD;  Location: [facility] ENDOSCOPY;  Service: Gastroenterology;  Laterality: N/A;  . PACEMAKER INSERTION     HPI:  Pt is a 86 y.o. female w/ h/o of CAD, hypertension, GI bleed, afib, sick sinus syndrome status post pacemaker, CVA in 2016 per chart notes presented with cough, sore throat, SOB.  Chest CT on 11/21/2019 revealed: "Patulous, partially fluid-filled esophagus, which may place the patient at risk for aspiration; Fluid in the trachea, concerning for aspiration or accumulated secretions; Scattered ground-glass airspace opacities, most conspicuous in the dependent bilateral lower lobes, nonspecific and infectious or inflammatory".  Pt endorses inconsistent s/s of Reflux at home prior to hospitalization.    Assessment / Plan / Recommendation Clinical Impression  Pt appears to present w/ grossly adequate oropharyngeal phase swallowing function w/ No overt, immediate clinical s/s of aspiration noted during/post trials -- though limited trials accepted by pt. Pt appears at reduced risk for prandial/oropharyngeal phase aspiration when following general aspiration precautions. HOWEVER, suspect pt has Esophageal  phase dysmotility as per Chest CT results indicating "Patulous, partially fluid-filled esophagus, which may place the patient at risk for aspiration; Fluid in the trachea, concerning for aspiration or accumulated secretions; Scattered ground-glass airspace opacities, most conspicuous in the dependent bilateral lower lobes, nonspecific and infectious or inflammatory" w/ presenting (increased/moderate) Esophageal mucous/phlegm currently. Per NSG today, pt tolerated swallowing Pills w/out difficulty. Setup support w/ po's given. Pt helped to feed self/hold Cup and consumed trials of liquids via Cup/Straw, then few trials of puree/soft solid w/ No immediate, overt s/s of aspiration noted; no decline in vocal quality or decline in respiratory status during/post trials. Same wet, crackly/congested respirations noted b/t trials -- No increase/change in such w/ po intake. Oral phase appeared [facility] for bolus management, mastication, A-P transfer and oral clearing. SLP alternated w/ liquid to aid clearing. OM exam appeared [facility] w/ No unilateral Lingual/Labial weakness noted. Pt did Not c/o of any Esophageal phase dysmotility when eating/drinking the few trials; no Regurgitation. Discussed w/ NSG/MD and pt concern for the Eosphageal phase dysmotitility w/ increased phlegm/fluid and Increased Risk for REGURGITATION of REFLUX material w/ any Esophageal phase Dysmotility/deficits thus risk for Pulmonary impact if Reflux/Phlegm is aspirated. Recommend a more Mech Soft diet consistency(meats cut, moist foods) for conservation of energy and Rest Breaks during meals to lessen SOB/WOB w/ thin liquids w/ general aspiration precautions; REFLUX precautions. Recommend Pills in puree if needed for ease of swallowing as needed. Recommend Dietician consult and f/u for support; GI consult for formal Esophageal phase assessment/management. ST services to monitor for further assessment, education as needed. SLP Visit Diagnosis: Dysphagia,  pharyngoesophageal phase (R13.14)    Aspiration Risk  Mild aspiration risk;Risk for inadequate nutrition/hydration(reduced following general precs)    Diet  Recommendation  more of a Mech Soft diet consistency for ease of eating, conservation of energy and Rest Breaks during meals to lessen SOB/WOB; thin liquids. General aspiration precautions. STRICT REFLUX PRECAUTIONS. Tray setup and support positioning upright at meals.  Medication Administration: Whole meds with puree(IF needed for easier swallowing d/t age, respiratory status)    Other  Recommendations Recommended Consults: Consider GI evaluation;Consider esophageal assessment(Dietician f/u; Palliative Care f/u) Oral Care Recommendations: Oral care BID;Oral care before and after PO;Staff/trained caregiver to provide oral care Other Recommendations: (n/a)   Follow up Recommendations None(TBD)      Frequency and Duration min 2x/week  1 week       Prognosis Prognosis for Safe Diet Advancement: Fair Barriers to Reach Goals: Time post onset;Severity of deficits(Esophageal dysmotility) Barriers/Prognosis Comment: needs GI consult/and f/u for education      Swallow Study   General Date of Onset: 11/20/19 HPI: Pt is a 86 y.o. female w/ h/o of CAD, hypertension, GI bleed, afib, sick sinus syndrome status post pacemaker, CVA in 2016 per chart notes presented with cough, sore throat, SOB.  Chest CT on 11/21/2019 revealed: "Patulous, partially fluid-filled esophagus, which may place the patient at risk for aspiration; Fluid in the trachea, concerning for aspiration or accumulated secretions; Scattered ground-glass airspace opacities, most conspicuous in the dependent bilateral lower lobes, nonspecific and infectious or inflammatory".  Pt endorses inconsistent s/s of Reflux at home prior to hospitalization.  Type of Study: Bedside Swallow Evaluation Previous Swallow Assessment: none reported Diet Prior to this Study: Regular;Thin liquids(per pt  report) Temperature Spikes Noted: No(wbc 29.1) Respiratory Status: Nasal cannula(7L) History of Recent Intubation: No Behavior/Cognition: Alert;Cooperative;Pleasant mood;Distractible;Requires cueing Oral Cavity Assessment: Within Functional Limits Oral Care Completed by SLP: Yes Oral Cavity - Dentition: Adequate natural dentition Vision: Functional for self-feeding Self-Feeding Abilities: Able to feed self;Needs assist;Needs set up Patient Positioning: Upright in bed(needed min cues and support to sit upright) Baseline Vocal Quality: Normal(min congested, wet quality intermittently) Volitional Cough: Strong;Congested Volitional Swallow: Able to elicit    Oral/Motor/Sensory Function Overall Oral Motor/Sensory Function: Within functional limits   Ice Chips Ice chips: Within functional limits Presentation: Spoon(fed; 1 trial)   Thin Liquid Thin Liquid: Within functional limits Presentation: Self Fed;Cup;Straw(3 trials via straw; 2 via cup) Other Comments: pt declined further    Nectar Thick Nectar Thick Liquid: Not tested   Honey Thick Honey Thick Liquid: Not tested   Puree Puree: Within functional limits Presentation: Spoon;Self Fed(2 trials) Other Comments: pt declined further   Solid     Solid: Within functional limits Presentation: Spoon;Self Fed(2 trials ) Other Comments: pt declined further       Orinda Kenner, MS, CCC-SLP Brooke Potts 11/21/2019,4:23 PM

## 2019-11-21 NOTE — Progress Notes (Signed)
PROGRESS NOTE    Brooke Potts  BSJ:[id number] DOB: 25-Sep-1923 DOA: 11/20/2019 PCP: Gracelyn Nurse, MD    Brief Narrative:  Brooke Potts is a 86 y.o. female  History of CAD, hypertension, sick sinus syndrome status post pacemaker, CVA presented with cough, sore throat, SOB. In the ED, patient was found temperature 97.6, heart rate 93-1 1 2, respiratory rate 26, blood pressure 165/83, oxygen saturation 94%. Labs shows glucose 146, otherwise unremarkable CMP.  Procalcitonin less than 0.1, WBC 22.3, hemoglobin 16.4, HCT 47.8, neutrophils 86%.  Covid rapid antigen negative.  Covid and flu test negative. .     Consultants:   none  Procedures: none  Antimicrobials:   Ceftriaxone and azithro    Subjective: Pt reports little better, still with some sore throat. No cp or other complaints  Objective: Vitals:   11/20/19 2020 11/21/19 0451 11/21/19 0813 11/21/19 1255  BP: (!) 164/75 136/62  (!) 150/61  Pulse: 97 90  100  Resp: (!) 28 (!) 24  20  Temp: 98.1 F (36.7 C) 98.6 F (37 C)  99.4 F (37.4 C)  TempSrc: Oral Oral  Axillary  SpO2: 98% 97% 99% 95%  Weight: 53.3 kg     Height: 5\' 1"  (1.549 m)       Intake/Output Summary (Last 24 hours) at 11/21/2019 1311 Last data filed at 11/21/2019 0645 Gross per 24 hour  Intake 340.14 ml  Output 0 ml  Net 340.14 ml   Filed Weights   11/20/19 1313 11/20/19 2020  Weight: 54.4 kg 53.3 kg    Examination:  General exam: Appears calm and comfortable  Respiratory system: mildly junky/rhonchorus, gurggling of upper airway . No wheezing Cardiovascular system: S1 & S2 heard, RRR. No murmurs, rubs, gallops or clicks.  Gastrointestinal system: Abdomen is nondistended, soft and nontender.  Normal bowel sounds heard. Central nervous system: Alert and awake.  Grossly intact Extremities: No edema Skin: Warm dry Psychiatry: Mood & affect appropriate in current setting.     Data Reviewed: I have personally reviewed following  labs and imaging studies  CBC: Recent Labs  Lab 11/20/19 1359 11/21/19 0605  WBC 22.3* 29.1*  NEUTROABS 19.2* 24.3*  HGB 16.4* 14.6  HCT 47.8* 42.8  MCV 95.6 96.0  PLT 314 265   Basic Metabolic Panel: Recent Labs  Lab 11/20/19 1359 11/21/19 0605  NA 135 135  K 3.9 3.7  CL 100 100  CO2 25 26  GLUCOSE 146* 127*  BUN 19 23  CREATININE 0.84 1.02*  CALCIUM 9.5 8.8*   GFR: Estimated Creatinine Clearance: 25.4 mL/min (A) (by C-G formula based on SCr of 1.02 mg/dL (H)). Liver Function Tests: Recent Labs  Lab 11/20/19 1359  AST 35  ALT 27  ALKPHOS 98  BILITOT 0.9  PROT 8.5*  ALBUMIN 4.3   No results for input(s): LIPASE, AMYLASE in the last 168 hours. No results for input(s): AMMONIA in the last 168 hours. Coagulation Profile: No results for input(s): INR, PROTIME in the last 168 hours. Cardiac Enzymes: No results for input(s): CKTOTAL, CKMB, CKMBINDEX, TROPONINI in the last 168 hours. BNP (last 3 results) No results for input(s): PROBNP in the last 8760 hours. HbA1C: No results for input(s): HGBA1C in the last 72 hours. CBG: No results for input(s): GLUCAP in the last 168 hours. Lipid Profile: No results for input(s): CHOL, HDL, LDLCALC, TRIG, CHOLHDL, LDLDIRECT in the last 72 hours. Thyroid Function Tests: No results for input(s): TSH, T4TOTAL, FREET4, T3FREE, THYROIDAB in the  last 72 hours. Anemia Panel: No results for input(s): VITAMINB12, FOLATE, FERRITIN, TIBC, IRON, RETICCTPCT in the last 72 hours. Sepsis Labs: Recent Labs  Lab 11/20/19 1400  PROCALCITON <0.10    Recent Results (from the past 240 hour(s))  Respiratory Panel by RT PCR (Flu A&B, Covid) - Nasopharyngeal Swab     Status: None   Collection Time: 11/20/19  3:21 PM   Specimen: Nasopharyngeal Swab  Result Value Ref Range Status   SARS Coronavirus 2 by RT PCR NEGATIVE NEGATIVE Final    Comment: (NOTE) SARS-CoV-2 target nucleic acids are NOT DETECTED. The SARS-CoV-2 RNA is generally  detectable in upper respiratoy specimens during the acute phase of infection. The lowest concentration of SARS-CoV-2 viral copies this assay can detect is 131 copies/mL. A negative result does not preclude SARS-Cov-2 infection and should not be used as the sole basis for treatment or other patient management decisions. A negative result may occur with  improper specimen collection/handling, submission of specimen other than nasopharyngeal swab, presence of viral mutation(s) within the areas targeted by this assay, and inadequate number of viral copies (<131 copies/mL). A negative result must be combined with clinical observations, patient history, and epidemiological information. The expected result is Negative. Fact Sheet for Patients:  https://www.moore.com/ Fact Sheet for Healthcare Providers:  https://www.young.biz/ This test is not yet ap proved or cleared by the [place] FDA and  has been authorized for detection and/or diagnosis of SARS-CoV-2 by FDA under an Emergency Use Authorization (EUA). This EUA will remain  in effect (meaning this test can be used) for the duration of the COVID-19 declaration under Section 564(b)(1) of the Act, 21 U.S.C. section 360bbb-3(b)(1), unless the authorization is terminated or revoked sooner.    Influenza A by PCR NEGATIVE NEGATIVE Final   Influenza B by PCR NEGATIVE NEGATIVE Final    Comment: (NOTE) The Xpert Xpress SARS-CoV-2/FLU/RSV assay is intended as an aid in  the diagnosis of influenza from Nasopharyngeal swab specimens and  should not be used as a sole basis for treatment. Nasal washings and  aspirates are unacceptable for Xpert Xpress SARS-CoV-2/FLU/RSV  testing. Fact Sheet for Patients: https://www.moore.com/ Fact Sheet for Healthcare Providers: https://www.young.biz/ This test is not yet approved or cleared by the [place] FDA and  has been  authorized for detection and/or diagnosis of SARS-CoV-2 by  FDA under an Emergency Use Authorization (EUA). This EUA will remain  in effect (meaning this test can be used) for the duration of the  Covid-19 declaration under Section 564(b)(1) of the Act, 21  U.S.C. section 360bbb-3(b)(1), unless the authorization is  terminated or revoked. Performed at [facility], [address] [place], [place] [address]          Radiology Studies: CT ANGIO CHEST PE W OR WO CONTRAST  Result Date: 11/21/2019 CLINICAL DATA:  Hypoxia, elevated D-dimer EXAM: CT ANGIOGRAPHY CHEST WITH CONTRAST TECHNIQUE: Multidetector CT imaging of the chest was performed using the standard protocol during bolus administration of intravenous contrast. Multiplanar CT image reconstructions and MIPs were obtained to evaluate the vascular anatomy. CONTRAST:  80mL OMNIPAQUE IOHEXOL 350 MG/ML SOLN COMPARISON:  Same-day chest radiographs. FINDINGS: Cardiovascular: Satisfactory opacification of the pulmonary arteries to the segmental level. No evidence of pulmonary embolism. Cardiomegaly. Three-vessel coronary artery calcifications. No pericardial effusion. Aortic atherosclerosis. Mediastinum/Nodes: No enlarged mediastinal, hilar, or axillary lymph nodes. Fluid in the trachea. Patulous, partially fluid-filled esophagus. Lungs/Pleura: Scattered ground-glass airspace opacities, most conspicuous in the dependent bilateral lower lobes. No pleural  effusion or pneumothorax. Upper Abdomen: No acute abnormality. Large gallstone in the partially imaged gallbladder. Musculoskeletal: No chest wall abnormality. No acute or significant osseous findings. Review of the MIP images confirms the above findings. IMPRESSION: 1. Negative examination for pulmonary embolism. 2. Scattered ground-glass airspace opacities, most conspicuous in the dependent bilateral lower lobes, nonspecific and infectious or inflammatory. 3. Fluid in the trachea, concerning  for aspiration or accumulated secretions. 4. Patulous, partially fluid-filled esophagus, which may place the patient at risk for aspiration. 5. Cardiomegaly.  Coronary artery disease. 6. Aortic Atherosclerosis (ICD10-I70.0). Electronically Signed   By: Eddie Candle M.D.   On: 11/21/2019 12:42   DG Chest Port 1 View  Result Date: 11/20/2019 CLINICAL DATA:  Shortness of breath and cough. EXAM: PORTABLE CHEST 1 VIEW COMPARISON:  04/09/2013 FINDINGS: The heart size and pulmonary vascularity are normal considering the AP portable technique. Pacemaker in place. Aortic atherosclerosis. The lungs are clear. No effusions. No pneumothorax. No acute bone abnormality. IMPRESSION: No acute abnormality. Aortic Atherosclerosis (ICD10-I70.0). Electronically Signed   By: Lorriane Shire M.D.   On: 11/20/2019 13:40        Scheduled Meds: . ALPRAZolam  0.125 mg Oral q morning - 10a  . ALPRAZolam  0.25 mg Oral QHS  . amLODipine  5 mg Oral Daily  . aspirin EC  81 mg Oral Daily  . docusate sodium  100 mg Oral Daily  . fluticasone  2 spray Each Nare Daily  . heparin  5,000 Units Subcutaneous Q8H  . latanoprost  1 drop Both Eyes QHS  . lisinopril  20 mg Oral Daily  . timolol  1 drop Both Eyes Daily   Continuous Infusions: . azithromycin Stopped (11/20/19 1647)  . cefTRIAXone (ROCEPHIN)  IV      Assessment & Plan:   Principal Problem:   SOB (shortness of breath) Active Problems:   Leukocytosis   Pneumonia   Hypoxia   #SOB, cough, sore throat/hypoxia: Ordered CT scan see results-concern for aspiration PNA. Wbc increasing Will change abx to zosyn and continue azithro D/c ceftrixone Discussed with nursing about diligent suctioning Procalcitonin less than 0.1, Flu and Covid test negative Asp. Risk precautions Npo except meds Swallowing evaluation consult  HOB elevated Ddimer elevated- ordered CT chest ..>neg. PE. See results.   #Leukocytosis: No history of leukocytosis. Etiology likely  bronchitis/pneumonia, see above UA pending although UTI unlikely given asymptomatic  #History of CAD, hypertension, sick sinus syndrome status post pacemaker, CVA: Continue home medications if appropriate follow-up with his PCP, specialist when necessary   Code Status: DNR- spoke to daugther Benjie Karvonen who wants pt to be DNR. DVT Prophylaxis: heparin Family Communication:updated daughter Remo Lipps Disposition Plan: We will likely be inpatient 1-2 more midnight days until from pulmonary standpoint she is more stable as she is requiring more oxygenation, needs speech pathology evaluation as she is at risk of aspiration pneumonia.       LOS: 1 day   Time spent: 45 minutes with more than 50% COC    Nolberto Hanlon, MD [facility] Pager [phone]  If 7PM-7AM, please contact night-coverage [email or web address] Password [email or web address] 11/21/2019, 1:11 PM

## 2019-11-21 NOTE — Consult Note (Signed)
Pharmacy Antibiotic Note  Brooke Potts is a 86 y.o. female admitted on 11/20/2019 with aspiration pneumonia.  Pharmacy has been consulted for Zosyn dosing.  Plan: Zosyn 3.375g IV q8h (4 hour infusion).  Height: 5\' 1"  (154.9 cm) Weight: 117 lb 8.1 oz (53.3 kg) IBW/kg (Calculated) : 47.8  Temp (24hrs), Avg:98.7 F (37.1 C), Min:98.1 F (36.7 C), Max:99.4 F (37.4 C)  Recent Labs  Lab 11/20/19 1359 11/21/19 0605  WBC 22.3* 29.1*  CREATININE 0.84 1.02*    Estimated Creatinine Clearance: 25.4 mL/min (A) (by C-G formula based on SCr of 1.02 mg/dL (H)).    Allergies  Allergen Reactions  . Tramadol Nausea And Vomiting and Nausea Only    Antimicrobials this admission: 02/05 CTX & Azith x 1 02/6 Zosyn >>  Dose adjustments this admission: none  Microbiology results: UCx pending Resp Panel/Sputum Cx pending COVID/FLU NEG  Thank you for allowing pharmacy to be a part of this patient's care.  04/6, PharmD, BCPS Clinical Pharmacist 11/21/2019 1:47 PM

## 2019-11-22 LAB — CBC
HCT: 37.4 % (ref 36.0–46.0)
Hemoglobin: 12.7 g/dL (ref 12.0–15.0)
MCH: 32.8 pg (ref 26.0–34.0)
MCHC: 34 g/dL (ref 30.0–36.0)
MCV: 96.6 fL (ref 80.0–100.0)
Platelets: 223 10*3/uL (ref 150–400)
RBC: 3.87 MIL/uL (ref 3.87–5.11)
RDW: 13.6 % (ref 11.5–15.5)
WBC: 16.6 10*3/uL — ABNORMAL HIGH (ref 4.0–10.5)
nRBC: 0 % (ref 0.0–0.2)

## 2019-11-22 LAB — URINE CULTURE: Culture: NO GROWTH

## 2019-11-22 LAB — CREATININE, SERUM
Creatinine, Ser: 1.16 mg/dL — ABNORMAL HIGH (ref 0.44–1.00)
GFR calc Af Amer: 47 mL/min — ABNORMAL LOW (ref >60)
GFR calc non Af Amer: 40 mL/min — ABNORMAL LOW (ref >60)

## 2019-11-22 MED ORDER — SODIUM CHLORIDE 0.9 % IV SOLN
3.0000 g | Freq: Two times a day (BID) | INTRAVENOUS | Status: DC
  Administered 2019-11-22 – 2019-11-25 (×6): 3 g via INTRAVENOUS
  Filled 2019-11-22: qty 8
  Filled 2019-11-22: qty 3
  Filled 2019-11-22: qty 8
  Filled 2019-11-22 (×3): qty 3
  Filled 2019-11-22: qty 8
  Filled 2019-11-22: qty 3
  Filled 2019-11-22: qty 8

## 2019-11-22 MED ORDER — SODIUM CHLORIDE 0.9 % IV SOLN: 1.5000 g | Freq: Four times a day (QID) | INTRAVENOUS | Status: DC

## 2019-11-22 MED ORDER — PANTOPRAZOLE SODIUM 40 MG PO TBEC
40.0000 mg | DELAYED_RELEASE_TABLET | Freq: Every day | ORAL | Status: DC
  Administered 2019-11-22 – 2019-11-25 (×4): 40 mg via ORAL
  Filled 2019-11-22 (×4): qty 1

## 2019-11-22 MED ORDER — HYDROCHLOROTHIAZIDE 12.5 MG PO CAPS
12.5000 mg | ORAL_CAPSULE | Freq: Every day | ORAL | Status: DC
  Administered 2019-11-23 – 2019-11-25 (×3): 12.5 mg via ORAL
  Filled 2019-11-22 (×3): qty 1

## 2019-11-22 MED ORDER — SODIUM CHLORIDE 3 % IN NEBU
4.0000 mL | INHALATION_SOLUTION | Freq: Two times a day (BID) | RESPIRATORY_TRACT | Status: AC
  Administered 2019-11-22 – 2019-11-24 (×5): 4 mL via RESPIRATORY_TRACT
  Filled 2019-11-22 (×6): qty 4

## 2019-11-22 NOTE — Consult Note (Signed)
Pulmonary Medicine          Date: 11/22/2019,   MRN# [id number] Brooke Potts 1922-12-15     AdmissionWeight: 54.4 kg                 CurrentWeight: 53.3 kg  Referring physician Dr. Kurtis Bushman    CHIEF COMPLAINT:   Recurrent aspiration pneumonia   HISTORY OF PRESENT ILLNESS   This is a pleasant 86 year old female with a history of CAD, essential hypertension, arrhythmias, previous CVA who came in with complaints of cough sore throat and persistent shortness of breath.  Patient is not a good historian she denies sick contacts flulike illness, peripheral edema chest pain myalgias, in the ER she was mildly tachypneic with normal oxygen saturations on room air COVID-19 test was negative her blood work was significant only for an elevated WBC count at 22.3.  Without unremarkable chest x-ray and EKG that shows V paced.  Patient was admitted to hospitalist service with empiric treatment for pneumonia with Zithromax and Rocephin.  Pulmonary consultation was placed due to complaints of recurrent aspiration as well as abnormal CT chest showing debris in the airways. She admits to "runny watery" stools for past 1 week.    PAST MEDICAL HISTORY   Past Medical History:  Diagnosis Date  . Coronary artery disease   . Dysrhythmia   . Hypertension   . Pacemaker   . Stroke [facility])      SURGICAL HISTORY   Past Surgical History:  Procedure Laterality Date  . COLONOSCOPY WITH PROPOFOL N/A 12/11/2016   Procedure: COLONOSCOPY WITH PROPOFOL;  Surgeon: Jonathon Bellows, MD;  Location: [facility] ENDOSCOPY;  Service: Gastroenterology;  Laterality: N/A;  . ESOPHAGOGASTRODUODENOSCOPY N/A 12/09/2016   Procedure: ESOPHAGOGASTRODUODENOSCOPY (EGD);  Surgeon: Lin Landsman, MD;  Location: [facility] ENDOSCOPY;  Service: Gastroenterology;  Laterality: N/A;  . PACEMAKER INSERTION       FAMILY HISTORY   Family History  Problem Relation Age of Onset  . Breast cancer Mother 92  . Peripheral vascular disease  Mother   . Diabetes Mother   . Stroke Father   . Diabetes Father   . Diabetes Sister   . Diabetes Brother      SOCIAL HISTORY   Social History   Tobacco Use  . Smoking status: Never Smoker  . Smokeless tobacco: Never Used  Substance Use Topics  . Alcohol use: No  . Drug use: No     MEDICATIONS    Home Medication:    Current Medication:  Current Facility-Administered Medications:  .  ALPRAZolam (XANAX) tablet 0.125 mg, 0.125 mg, Oral, q morning - 10a, Lenna Sciara, MD, 0.125 mg at 11/21/19 1449 .  ALPRAZolam Duanne Moron) tablet 0.25 mg, 0.25 mg, Oral, QHS, Lenna Sciara, MD, 0.25 mg at 11/21/19 2207 .  amLODipine (NORVASC) tablet 5 mg, 5 mg, Oral, Daily, Lenna Sciara, MD, 5 mg at 11/21/19 1450 .  aspirin EC tablet 81 mg, 81 mg, Oral, Daily, Lenna Sciara, MD, 81 mg at 11/21/19 1450 .  azithromycin (ZITHROMAX) 500 mg in sodium chloride 0.9 % 250 mL IVPB, 500 mg, Intravenous, Q24H, Lenna Sciara, MD, Stopped at 11/21/19 1846 .  docusate sodium (COLACE) capsule 100 mg, 100 mg, Oral, Daily, Lenna Sciara, MD, 100 mg at 11/21/19 1450 .  fluticasone (FLONASE) 50 MCG/ACT nasal spray 2 spray, 2 spray, Each Nare, Daily, Lenna Sciara, MD, 2 spray at 11/21/19 1451 .  heparin injection 5,000 Units, 5,000 Units, Subcutaneous, Q8H, Lenna Sciara, MD, 5,000 Units  at 11/22/19 0524 .  hydrALAZINE (APRESOLINE) tablet 10 mg, 10 mg, Oral, Q8H PRN, Rayne Du, MD .  latanoprost (XALATAN) 0.005 % ophthalmic solution 1 drop, 1 drop, Both Eyes, QHS, Rayne Du, MD, 1 drop at 11/21/19 2207 .  lisinopril (ZESTRIL) tablet 20 mg, 20 mg, Oral, Daily, Rayne Du, MD, 20 mg at 11/21/19 1450 .  MEDLINE mouth rinse, 15 mL, Mouth Rinse, BID, Lynn Ito, MD, 15 mL at 11/21/19 2219 .  pantoprazole (PROTONIX) EC tablet 40 mg, 40 mg, Oral, Daily, Amery, Sahar, MD .  piperacillin-tazobactam (ZOSYN) IVPB 3.375 g, 3.375 g, Intravenous, Q8H, Amery, Sahar, MD, Last Rate: 12.5 mL/hr at 11/22/19 0523, 3.375 g  at 11/22/19 0523 .  timolol (TIMOPTIC) 0.5 % ophthalmic solution 1 drop, 1 drop, Both Eyes, Daily, Rayne Du, MD, 1 drop at 11/21/19 1451    ALLERGIES   Tramadol     REVIEW OF SYSTEMS    Review of Systems:  Gen:  Denies  fever, sweats, chills weigh loss  HEENT: Denies blurred vision, double vision, ear pain, eye pain, hearing loss, nose bleeds, sore throat Cardiac:  No dizziness, chest pain or heaviness, chest tightness,edema Resp:   Reports cough , denies sputum porduction, shortness of breath,wheezing, hemoptysis,  Gi: Denies swallowing difficulty, stomach pain, nausea or vomiting, diarrhea, constipation, bowel incontinence Gu:  Denies bladder incontinence, burning urine Ext:   Denies Joint pain, stiffness or swelling Skin: Denies  skin rash, easy bruising or bleeding or hives Endoc:  Denies polyuria, polydipsia , polyphagia or weight change Psych:   Denies depression, insomnia or hallucinations   Other:  All other systems negative   VS: BP (!) 148/86 (BP Location: Left Arm)   Pulse 79   Temp 97.8 F (36.6 C) (Oral)   Resp 20   Ht 5\' 1"  (1.549 m)   Wt 53.3 kg   SpO2 95%   BMI 22.20 kg/m      PHYSICAL EXAM    GENERAL:NAD, no fevers, chills, no weakness no fatigue HEAD: Normocephalic, atraumatic.  EYES: Pupils equal, round, reactive to light. Extraocular muscles intact. No scleral icterus.  MOUTH: Moist mucosal membrane. Dentition intact. No abscess noted.  EAR, NOSE, THROAT: Clear without exudates. No external lesions.  NECK: Supple. No thyromegaly. No nodules. No JVD.  PULMONARY: Diffuse coarse rhonchi bilaterally without wheezing CARDIOVASCULAR: S1 and S2. Regular rate and rhythm. No murmurs, rubs, or gallops. No edema. Pedal pulses 2+ bilaterally.  GASTROINTESTINAL: Soft, nontender, nondistended. No masses. Positive bowel sounds. No hepatosplenomegaly.  MUSCULOSKELETAL: No swelling, clubbing, or edema. Range of motion full in all extremities.    NEUROLOGIC: Cranial nerves II through XII are intact. No gross focal neurological deficits. Sensation intact. Reflexes intact.  SKIN: No ulceration, lesions, rashes, or cyanosis. Skin warm and dry. Turgor intact.  PSYCHIATRIC: Mood, affect within normal limits. The patient is awake, alert and oriented x 3. Insight, judgment intact.       IMAGING    CT ANGIO CHEST PE W OR WO CONTRAST  Result Date: 11/21/2019 CLINICAL DATA:  Hypoxia, elevated D-dimer EXAM: CT ANGIOGRAPHY CHEST WITH CONTRAST TECHNIQUE: Multidetector CT imaging of the chest was performed using the standard protocol during bolus administration of intravenous contrast. Multiplanar CT image reconstructions and MIPs were obtained to evaluate the vascular anatomy. CONTRAST:  12mL OMNIPAQUE IOHEXOL 350 MG/ML SOLN COMPARISON:  Same-day chest radiographs. FINDINGS: Cardiovascular: Satisfactory opacification of the pulmonary arteries to the segmental level. No evidence of pulmonary embolism. Cardiomegaly. Three-vessel coronary artery calcifications. No pericardial  effusion. Aortic atherosclerosis. Mediastinum/Nodes: No enlarged mediastinal, hilar, or axillary lymph nodes. Fluid in the trachea. Patulous, partially fluid-filled esophagus. Lungs/Pleura: Scattered ground-glass airspace opacities, most conspicuous in the dependent bilateral lower lobes. No pleural effusion or pneumothorax. Upper Abdomen: No acute abnormality. Large gallstone in the partially imaged gallbladder. Musculoskeletal: No chest wall abnormality. No acute or significant osseous findings. Review of the MIP images confirms the above findings. IMPRESSION: 1. Negative examination for pulmonary embolism. 2. Scattered ground-glass airspace opacities, most conspicuous in the dependent bilateral lower lobes, nonspecific and infectious or inflammatory. 3. Fluid in the trachea, concerning for aspiration or accumulated secretions. 4. Patulous, partially fluid-filled esophagus, which may  place the patient at risk for aspiration. 5. Cardiomegaly.  Coronary artery disease. 6. Aortic Atherosclerosis (ICD10-I70.0). Electronically Signed   By: Lauralyn Primes M.D.   On: 11/21/2019 12:42   DG Chest Port 1 View  Result Date: 11/20/2019 CLINICAL DATA:  Shortness of breath and cough. EXAM: PORTABLE CHEST 1 VIEW COMPARISON:  04/09/2013 FINDINGS: The heart size and pulmonary vascularity are normal considering the AP portable technique. Pacemaker in place. Aortic atherosclerosis. The lungs are clear. No effusions. No pneumothorax. No acute bone abnormality. IMPRESSION: No acute abnormality. Aortic Atherosclerosis (ICD10-I70.0). Electronically Signed   By: Francene Boyers M.D.   On: 11/20/2019 13:40      ASSESSMENT/PLAN   Recurrent aspiration pneumonia -Status post swallow evaluation with findings consistent with aspiration risk. - mechanical soft diet with reflux precautions and breaks as per speech pathology -will switch antibiotics to Unasyn as mostly likely etiology is aspiration pneumonia at this time  -RVP in process - ground glass infiltrate - possible viral LRTI -Unasyn 1.5g q6h -pharmacy consultation for pharmacodynamics Respiratory cultures in process -pt with watery stools - will obtain legionella ab -hypertonic saline to induce cough for bronchopulmonary hygiene -elevate head of bed to 30 degrees   Thank you for allowing me to participate in the care of this patient.    Patient/Family are satisfied with care plan and all questions have been answered.   This document was prepared using Dragon voice recognition software and may include unintentional dictation errors.     Vida Rigger, M.D.  Division of Pulmonary & Critical Care Medicine  [facility]

## 2019-11-22 NOTE — Consult Note (Signed)
Pharmacy Antibiotic Note  Brooke Potts is a 86 y.o. female admitted on 11/20/2019 with aspiration pneumonia.  Pharmacy has been consulted for Unasyn dosing.  Plan: Will stop Zosyn and start  Unasyn 3g q12H  Height: 5\' 1"  (154.9 cm) Weight: 117 lb 8.1 oz (53.3 kg) IBW/kg (Calculated) : 47.8  Temp (24hrs), Avg:98.4 F (36.9 C), Min:97.8 F (36.6 C), Max:99.4 F (37.4 C)  Recent Labs  Lab 11/20/19 1359 11/21/19 0605 11/22/19 0624  WBC 22.3* 29.1* 16.6*  CREATININE 0.84 1.02* 1.16*    Estimated Creatinine Clearance: 22.4 mL/min (A) (by C-G formula based on SCr of 1.16 mg/dL (H)).    Allergies  Allergen Reactions  . Tramadol Nausea And Vomiting and Nausea Only    Antimicrobials this admission: 02/05 CTX & Azith x 1 02/6 Zosyn >>02/7 02/7 Unasyn >>  Dose adjustments this admission: none  Microbiology results: UCx pending Resp Panel/Sputum Cx pending COVID/FLU NEG  Thank you for allowing pharmacy to be a part of this patient's care.  04/7, PharmD, BCPS Clinical Pharmacist 11/22/2019 9:35 AM

## 2019-11-22 NOTE — Progress Notes (Signed)
PROGRESS NOTE    Brooke Potts  MBW:[id number] DOB: [birth year] DOA: 11/20/2019 PCP: Gracelyn Nurse, MD    Brief Narrative:  Brooke Potts is a 86 y.o. female  History of CAD, hypertension, sick sinus syndrome status post pacemaker, CVA presented with cough, sore throat, SOB. In the ED, patient was found temperature 97.6, heart rate 93-1 1 2, respiratory rate 26, blood pressure 165/83, oxygen saturation 94%. Labs shows glucose 146, otherwise unremarkable CMP.  Procalcitonin less than 0.1, WBC 22.3, hemoglobin 16.4, HCT 47.8, neutrophils 86%.  Covid rapid antigen negative.  Covid and flu test negative. .     Consultants:   none  Procedures: none  Antimicrobials:   Ceftriaxone and azithro    Subjective: Patient reports feeling a little bit better and less short of breath.  Still requiring of nasal cannula/HF [place] cough better  Objective: Vitals:   11/21/19 1930 11/21/19 2012 11/22/19 0531 11/22/19 1321  BP: (!) 129/55  (!) 148/86 (!) 139/56  Pulse: 73  79 70  Resp: 20  20 20   Temp: 98 F (36.7 C)  97.8 F (36.6 C) (!) 97.5 F (36.4 C)  TempSrc: Oral  Oral Oral  SpO2: 98% 94% 95% 100%  Weight:      Height:        Intake/Output Summary (Last 24 hours) at 11/22/2019 1349 Last data filed at 11/22/2019 0000 Gross per 24 hour  Intake 322.74 ml  Output 250 ml  Net 72.74 ml   Filed Weights   11/20/19 1313 11/20/19 2020  Weight: 54.4 kg 53.3 kg    Examination:  General exam: Appears calm and comfortable, less gurgly sounding Respiratory system:  junky/rhonchorus,  No wheezing Cardiovascular system: S1 & S2 heard, RRR. No murmurs, rubs, gallops or clicks.  Gastrointestinal system: Abdomen is nondistended, soft and nontender.  Normal bowel sounds heard. Central nervous system: Alert and awake x3.  Grossly intact Extremities: No edema or cyanosis Skin: Warm dry Psychiatry: Mood & affect appropriate in current setting.     Data Reviewed: I have personally  reviewed following labs and imaging studies  CBC: Recent Labs  Lab 11/20/19 1359 11/21/19 0605 11/22/19 0624  WBC 22.3* 29.1* 16.6*  NEUTROABS 19.2* 24.3*  --   HGB 16.4* 14.6 12.7  HCT 47.8* 42.8 37.4  MCV 95.6 96.0 96.6  PLT 314 265 223   Basic Metabolic Panel: Recent Labs  Lab 11/20/19 1359 11/21/19 0605 11/22/19 0624  NA 135 135  --   K 3.9 3.7  --   CL 100 100  --   CO2 25 26  --   GLUCOSE 146* 127*  --   BUN 19 23  --   CREATININE 0.84 1.02* 1.16*  CALCIUM 9.5 8.8*  --    GFR: Estimated Creatinine Clearance: 22.4 mL/min (A) (by C-G formula based on SCr of 1.16 mg/dL (H)). Liver Function Tests: Recent Labs  Lab 11/20/19 1359  AST 35  ALT 27  ALKPHOS 98  BILITOT 0.9  PROT 8.5*  ALBUMIN 4.3   No results for input(s): LIPASE, AMYLASE in the last 168 hours. No results for input(s): AMMONIA in the last 168 hours. Coagulation Profile: No results for input(s): INR, PROTIME in the last 168 hours. Cardiac Enzymes: No results for input(s): CKTOTAL, CKMB, CKMBINDEX, TROPONINI in the last 168 hours. BNP (last 3 results) No results for input(s): PROBNP in the last 8760 hours. HbA1C: No results for input(s): HGBA1C in the last 72 hours. CBG: No results for  input(s): GLUCAP in the last 168 hours. Lipid Profile: No results for input(s): CHOL, HDL, LDLCALC, TRIG, CHOLHDL, LDLDIRECT in the last 72 hours. Thyroid Function Tests: No results for input(s): TSH, T4TOTAL, FREET4, T3FREE, THYROIDAB in the last 72 hours. Anemia Panel: No results for input(s): VITAMINB12, FOLATE, FERRITIN, TIBC, IRON, RETICCTPCT in the last 72 hours. Sepsis Labs: Recent Labs  Lab 11/20/19 1400  PROCALCITON <0.10    Recent Results (from the past 240 hour(s))  Respiratory Panel by RT PCR (Flu A&B, Covid) - Nasopharyngeal Swab     Status: None   Collection Time: 11/20/19  3:21 PM   Specimen: Nasopharyngeal Swab  Result Value Ref Range Status   SARS Coronavirus 2 by RT PCR NEGATIVE  NEGATIVE Final    Comment: (NOTE) SARS-CoV-2 target nucleic acids are NOT DETECTED. The SARS-CoV-2 RNA is generally detectable in upper respiratoy specimens during the acute phase of infection. The lowest concentration of SARS-CoV-2 viral copies this assay can detect is 131 copies/mL. A negative result does not preclude SARS-Cov-2 infection and should not be used as the sole basis for treatment or other patient management decisions. A negative result may occur with  improper specimen collection/handling, submission of specimen other than nasopharyngeal swab, presence of viral mutation(s) within the areas targeted by this assay, and inadequate number of viral copies (<131 copies/mL). A negative result must be combined with clinical observations, patient history, and epidemiological information. The expected result is Negative. Fact Sheet for Patients:  PinkCheek.be Fact Sheet for Healthcare Providers:  GravelBags.it This test is not yet ap proved or cleared by the [place] FDA and  has been authorized for detection and/or diagnosis of SARS-CoV-2 by FDA under an Emergency Use Authorization (EUA). This EUA will remain  in effect (meaning this test can be used) for the duration of the COVID-19 declaration under Section 564(b)(1) of the Act, 21 U.S.C. section 360bbb-3(b)(1), unless the authorization is terminated or revoked sooner.    Influenza A by PCR NEGATIVE NEGATIVE Final   Influenza B by PCR NEGATIVE NEGATIVE Final    Comment: (NOTE) The Xpert Xpress SARS-CoV-2/FLU/RSV assay is intended as an aid in  the diagnosis of influenza from Nasopharyngeal swab specimens and  should not be used as a sole basis for treatment. Nasal washings and  aspirates are unacceptable for Xpert Xpress SARS-CoV-2/FLU/RSV  testing. Fact Sheet for Patients: PinkCheek.be Fact Sheet for Healthcare  Providers: GravelBags.it This test is not yet approved or cleared by the [place] FDA and  has been authorized for detection and/or diagnosis of SARS-CoV-2 by  FDA under an Emergency Use Authorization (EUA). This EUA will remain  in effect (meaning this test can be used) for the duration of the  Covid-19 declaration under Section 564(b)(1) of the Act, 21  U.S.C. section 360bbb-3(b)(1), unless the authorization is  terminated or revoked. Performed at [facility], [address] [place], [place] [address]   Urine culture     Status: None   Collection Time: 11/20/19  7:01 PM   Specimen: Urine, Random  Result Value Ref Range Status   Specimen Description   Final    URINE, RANDOM Performed at [facility], [address], [place], [place] [address]    Special Requests   Final    NONE Performed at [facility], [address], [place], [place] [address]    Culture   Final    NO GROWTH Performed at [facility], [place] [address], [place], [place]  [address]    Report Status 11/22/2019 FINAL  Final         Radiology Studies: CT ANGIO CHEST PE W OR WO CONTRAST  Result Date: 11/21/2019 CLINICAL DATA:  Hypoxia, elevated D-dimer EXAM: CT ANGIOGRAPHY CHEST WITH CONTRAST TECHNIQUE: Multidetector CT imaging of the chest was performed using the standard protocol during bolus administration of intravenous contrast. Multiplanar CT image reconstructions and MIPs were obtained to evaluate the vascular anatomy. CONTRAST:  52mL OMNIPAQUE IOHEXOL 350 MG/ML SOLN COMPARISON:  Same-day chest radiographs. FINDINGS: Cardiovascular: Satisfactory opacification of the pulmonary arteries to the segmental level. No evidence of pulmonary embolism. Cardiomegaly. Three-vessel coronary artery calcifications. No pericardial effusion. Aortic atherosclerosis. Mediastinum/Nodes: No enlarged mediastinal, hilar, or axillary lymph nodes. Fluid in the  trachea. Patulous, partially fluid-filled esophagus. Lungs/Pleura: Scattered ground-glass airspace opacities, most conspicuous in the dependent bilateral lower lobes. No pleural effusion or pneumothorax. Upper Abdomen: No acute abnormality. Large gallstone in the partially imaged gallbladder. Musculoskeletal: No chest wall abnormality. No acute or significant osseous findings. Review of the MIP images confirms the above findings. IMPRESSION: 1. Negative examination for pulmonary embolism. 2. Scattered ground-glass airspace opacities, most conspicuous in the dependent bilateral lower lobes, nonspecific and infectious or inflammatory. 3. Fluid in the trachea, concerning for aspiration or accumulated secretions. 4. Patulous, partially fluid-filled esophagus, which may place the patient at risk for aspiration. 5. Cardiomegaly.  Coronary artery disease. 6. Aortic Atherosclerosis (ICD10-I70.0). Electronically Signed   By: Lauralyn Primes M.D.   On: 11/21/2019 12:42        Scheduled Meds:  ALPRAZolam  0.125 mg Oral q morning - 10a   ALPRAZolam  0.25 mg Oral QHS   amLODipine  5 mg Oral Daily   aspirin EC  81 mg Oral Daily   docusate sodium  100 mg Oral Daily   fluticasone  2 spray Each Nare Daily   heparin  5,000 Units Subcutaneous Q8H   hydrochlorothiazide  12.5 mg Oral Daily   latanoprost  1 drop Both Eyes QHS   mouth rinse  15 mL Mouth Rinse BID   pantoprazole  40 mg Oral Daily   sodium chloride HYPERTONIC  4 mL Nebulization BID   timolol  1 drop Both Eyes Daily   Continuous Infusions:  ampicillin-sulbactam (UNASYN) IV 3 g (11/22/19 1308)    Assessment & Plan:   Principal Problem:   SOB (shortness of breath) Active Problems:   Leukocytosis   Pneumonia   Hypoxia   #SOB, cough, sore throat/hypoxia: Ordered CT scan see results-concern for aspiration PNA. Wbc improving Will change abx to zosyn and continue azithro D/c ceftrixone Discussed with nursing about diligent  suctioning Procalcitonin less than 0.1, Flu and Covid test negative Asp. Risk precautions Npo except meds Swallowing evaluation -mech soft diet. Strict reflux precaution HOB elevated Ddimer elevated- ordered CT chest ..>neg. PE. See results. Start protonix 40mg  qd Pulmonary consulted for CT findings- rec. switch antibiotics to Unasyn as mostly likely etiology is aspiration pneumonia at this time Respiratory cultures pending hypertonic saline to induce cough for bronchopulmonary hygiene Check Legionella since patient had watery stool although I stopped her stool softeners yesterday  #Leukocytosis:  Etiology likely bronchitis/asp pneumonia, see above, improving with iv zosyn, but switch to Unasyn today please see above  UA pending although UTI unlikely given asymptomatic  #History of CAD, hypertension, sick sinus syndrome status post pacemaker, CVA: Continue home medications if appropriate follow-up with his PCP, specialist when necessary   Code Status: DNR- spoke to daugther  who wants pt to be DNR. DVT Prophylaxis: heparin Family Communication:updated daughter Aurea Graff Disposition Plan: We will likely be inpatient 1-2 more midnight days until from pulmonary standpoint she is more stable as she is requiring more oxygenation. PT/OT evaluation .       LOS: 2 days   Time spent: 45 minutes with more than 50% COC    Lynn Ito, MD [facility] Pager [phone]  If 7PM-7AM, please contact night-coverage [email or web address] Password [facility] 11/22/2019, 1:49 PM Patient ID: Ayano Douthitt, female   DOB: 1923-06-15, 86 y.o.   MRN: [id number]

## 2019-11-23 ENCOUNTER — Inpatient Hospital Stay: Payer: Medicare Other

## 2019-11-23 LAB — CBC
HCT: 37.4 % (ref 36.0–46.0)
Hemoglobin: 12.3 g/dL (ref 12.0–15.0)
MCH: 32.1 pg (ref 26.0–34.0)
MCHC: 32.9 g/dL (ref 30.0–36.0)
MCV: 97.7 fL (ref 80.0–100.0)
Platelets: 222 10*3/uL (ref 150–400)
RBC: 3.83 MIL/uL — ABNORMAL LOW (ref 3.87–5.11)
RDW: 13.4 % (ref 11.5–15.5)
WBC: 11.5 10*3/uL — ABNORMAL HIGH (ref 4.0–10.5)
nRBC: 0 % (ref 0.0–0.2)

## 2019-11-23 LAB — CREATININE, SERUM
Creatinine, Ser: 1.15 mg/dL — ABNORMAL HIGH (ref 0.44–1.00)
GFR calc Af Amer: 47 mL/min — ABNORMAL LOW (ref >60)
GFR calc non Af Amer: 41 mL/min — ABNORMAL LOW (ref >60)

## 2019-11-23 NOTE — Consult Note (Signed)
Pulmonary Medicine          Date: 11/23/2019,   MRN# [id number] Brooke Potts January 04, 1923     AdmissionWeight: 54.4 kg                 CurrentWeight: 53.3 kg  Referring physician Dr. Kurtis Bushman    CHIEF COMPLAINT:   Recurrent aspiration pneumonia   SUBJECTIVE   Patient is clinically improved. Her WBC count is treding down, she has been afebrile and is in good spirits.  She did PT today and was able to get up out of bed to chair.  Repeat CXR today.  Continue hypersal until d/c.  IS and Acapella for home upon d/c please.  Can continue with augmentin 875/125 on outpatient for total of 7 day regimen.    Patient did report "black colored diarreah" , ive ordered occult fecal card.  Hb was 16.6  three days ago now its 12.3 today, patient had only net 590 positive in fluid balance so seems non-dilutional.   PAST MEDICAL HISTORY   Past Medical History:  Diagnosis Date  . Coronary artery disease   . Dysrhythmia   . Hypertension   . Pacemaker   . Stroke [facility])      SURGICAL HISTORY   Past Surgical History:  Procedure Laterality Date  . COLONOSCOPY WITH PROPOFOL N/A 12/11/2016   Procedure: COLONOSCOPY WITH PROPOFOL;  Surgeon: Jonathon Bellows, MD;  Location: [facility] ENDOSCOPY;  Service: Gastroenterology;  Laterality: N/A;  . ESOPHAGOGASTRODUODENOSCOPY N/A 12/09/2016   Procedure: ESOPHAGOGASTRODUODENOSCOPY (EGD);  Surgeon: Lin Landsman, MD;  Location: [facility] ENDOSCOPY;  Service: Gastroenterology;  Laterality: N/A;  . PACEMAKER INSERTION       FAMILY HISTORY   Family History  Problem Relation Age of Onset  . Breast cancer Mother 48  . Peripheral vascular disease Mother   . Diabetes Mother   . Stroke Father   . Diabetes Father   . Diabetes Sister   . Diabetes Brother      SOCIAL HISTORY   Social History   Tobacco Use  . Smoking status: Never Smoker  . Smokeless tobacco: Never Used  Substance Use Topics  . Alcohol use: No  . Drug use: No     MEDICATIONS      Home Medication:    Current Medication:  Current Facility-Administered Medications:  .  ALPRAZolam (XANAX) tablet 0.125 mg, 0.125 mg, Oral, q morning - 10a, Lenna Sciara, MD, 0.125 mg at 11/23/19 1051 .  ALPRAZolam Duanne Moron) tablet 0.25 mg, 0.25 mg, Oral, QHS, Lenna Sciara, MD, 0.25 mg at 11/22/19 2133 .  amLODipine (NORVASC) tablet 5 mg, 5 mg, Oral, Daily, Lenna Sciara, MD, 5 mg at 11/23/19 1052 .  Ampicillin-Sulbactam (UNASYN) 3 g in sodium chloride 0.9 % 100 mL IVPB, 3 g, Intravenous, Q12H, Shanlever, Pierce Crane, RPH, Last Rate: 200 mL/hr at 11/23/19 1210, 3 g at 11/23/19 1210 .  aspirin EC tablet 81 mg, 81 mg, Oral, Daily, Lenna Sciara, MD, 81 mg at 11/23/19 1052 .  docusate sodium (COLACE) capsule 100 mg, 100 mg, Oral, Daily, Lenna Sciara, MD, 100 mg at 11/22/19 1026 .  fluticasone (FLONASE) 50 MCG/ACT nasal spray 2 spray, 2 spray, Each Nare, Daily, Lenna Sciara, MD, 2 spray at 11/23/19 1052 .  heparin injection 5,000 Units, 5,000 Units, Subcutaneous, Q8H, Lenna Sciara, MD, 5,000 Units at 11/23/19 1415 .  hydrALAZINE (APRESOLINE) tablet 10 mg, 10 mg, Oral, Q8H PRN, Lenna Sciara, MD .  hydrochlorothiazide (MICROZIDE) capsule 12.5 mg, 12.5  mg, Oral, Daily, Devone Bonilla, MD, 12.5 mg at 11/23/19 1052 .  latanoprost (XALATAN) 0.005 % ophthalmic solution 1 drop, 1 drop, Both Eyes, QHS, Rayne Du, MD, 1 drop at 11/22/19 2134 .  MEDLINE mouth rinse, 15 mL, Mouth Rinse, BID, Lynn Ito, MD, 15 mL at 11/22/19 2135 .  pantoprazole (PROTONIX) EC tablet 40 mg, 40 mg, Oral, Daily, Marylu Lund, Sahar, MD, 40 mg at 11/23/19 1052 .  sodium chloride HYPERTONIC 3 % nebulizer solution 4 mL, 4 mL, Nebulization, BID, Karna Christmas, Kaira Stringfield, MD, 4 mL at 11/23/19 0735 .  timolol (TIMOPTIC) 0.5 % ophthalmic solution 1 drop, 1 drop, Both Eyes, Daily, Rayne Du, MD, 1 drop at 11/23/19 1052    ALLERGIES   Tramadol     REVIEW OF SYSTEMS    Review of Systems:  Gen:  Denies  fever, sweats, chills  weigh loss  HEENT: Denies blurred vision, double vision, ear pain, eye pain, hearing loss, nose bleeds, sore throat Cardiac:  No dizziness, chest pain or heaviness, chest tightness,edema Resp:   Reports cough , denies sputum porduction, shortness of breath,wheezing, hemoptysis,  Gi: Denies swallowing difficulty, stomach pain, nausea or vomiting, diarrhea, constipation, bowel incontinence Gu:  Denies bladder incontinence, burning urine Ext:   Denies Joint pain, stiffness or swelling Skin: Denies  skin rash, easy bruising or bleeding or hives Endoc:  Denies polyuria, polydipsia , polyphagia or weight change Psych:   Denies depression, insomnia or hallucinations   Other:  All other systems negative   VS: BP (!) 123/57 (BP Location: Right Arm)   Pulse 68   Temp (!) 97.4 F (36.3 C) (Oral)   Resp 18   Ht 5\' 1"  (1.549 m)   Wt 53.3 kg   SpO2 100%   BMI 22.20 kg/m      PHYSICAL EXAM    GENERAL:NAD, no fevers, chills, no weakness no fatigue HEAD: Normocephalic, atraumatic.  EYES: Pupils equal, round, reactive to light. Extraocular muscles intact. No scleral icterus.  MOUTH: Moist mucosal membrane. Dentition intact. No abscess noted.  EAR, NOSE, THROAT: Clear without exudates. No external lesions.  NECK: Supple. No thyromegaly. No nodules. No JVD.  PULMONARY: Diffuse coarse rhonchi bilaterally without wheezing CARDIOVASCULAR: S1 and S2. Regular rate and rhythm. No murmurs, rubs, or gallops. No edema. Pedal pulses 2+ bilaterally.  GASTROINTESTINAL: Soft, nontender, nondistended. No masses. Positive bowel sounds. No hepatosplenomegaly.  MUSCULOSKELETAL: No swelling, clubbing, or edema. Range of motion full in all extremities.  NEUROLOGIC: Cranial nerves II through XII are intact. No gross focal neurological deficits. Sensation intact. Reflexes intact.  SKIN: No ulceration, lesions, rashes, or cyanosis. Skin warm and dry. Turgor intact.  PSYCHIATRIC: Mood, affect within normal limits.  The patient is awake, alert and oriented x 3. Insight, judgment intact.       IMAGING    CT ANGIO CHEST PE W OR WO CONTRAST  Result Date: 11/21/2019 CLINICAL DATA:  Hypoxia, elevated D-dimer EXAM: CT ANGIOGRAPHY CHEST WITH CONTRAST TECHNIQUE: Multidetector CT imaging of the chest was performed using the standard protocol during bolus administration of intravenous contrast. Multiplanar CT image reconstructions and MIPs were obtained to evaluate the vascular anatomy. CONTRAST:  35mL OMNIPAQUE IOHEXOL 350 MG/ML SOLN COMPARISON:  Same-day chest radiographs. FINDINGS: Cardiovascular: Satisfactory opacification of the pulmonary arteries to the segmental level. No evidence of pulmonary embolism. Cardiomegaly. Three-vessel coronary artery calcifications. No pericardial effusion. Aortic atherosclerosis. Mediastinum/Nodes: No enlarged mediastinal, hilar, or axillary lymph nodes. Fluid in the trachea. Patulous, partially fluid-filled esophagus. Lungs/Pleura: Scattered ground-glass airspace  opacities, most conspicuous in the dependent bilateral lower lobes. No pleural effusion or pneumothorax. Upper Abdomen: No acute abnormality. Large gallstone in the partially imaged gallbladder. Musculoskeletal: No chest wall abnormality. No acute or significant osseous findings. Review of the MIP images confirms the above findings. IMPRESSION: 1. Negative examination for pulmonary embolism. 2. Scattered ground-glass airspace opacities, most conspicuous in the dependent bilateral lower lobes, nonspecific and infectious or inflammatory. 3. Fluid in the trachea, concerning for aspiration or accumulated secretions. 4. Patulous, partially fluid-filled esophagus, which may place the patient at risk for aspiration. 5. Cardiomegaly.  Coronary artery disease. 6. Aortic Atherosclerosis (ICD10-I70.0). Electronically Signed   By: Lauralyn Primes M.D.   On: 11/21/2019 12:42   DG Chest Port 1 View  Result Date: 11/20/2019 CLINICAL DATA:   Shortness of breath and cough. EXAM: PORTABLE CHEST 1 VIEW COMPARISON:  04/09/2013 FINDINGS: The heart size and pulmonary vascularity are normal considering the AP portable technique. Pacemaker in place. Aortic atherosclerosis. The lungs are clear. No effusions. No pneumothorax. No acute bone abnormality. IMPRESSION: No acute abnormality. Aortic Atherosclerosis (ICD10-I70.0). Electronically Signed   By: Francene Boyers M.D.   On: 11/20/2019 13:40      ASSESSMENT/PLAN   Recurrent aspiration pneumonia -Status post swallow evaluation with findings consistent with aspiration risk. - mechanical soft diet with reflux precautions and breaks as per speech pathology -will switch antibiotics to Unasyn as mostly likely etiology is aspiration pneumonia at this time  -RVP in process - ground glass infiltrate - possible viral LRTI -Unasyn 1.5g q6h -pharmacy consultation for pharmacodynamics Respiratory cultures in process -pt with watery stools - will obtain legionella ab-stool softners stopped  -hypertonic saline to induce cough for bronchopulmonary hygiene -elevated head of bed to 30 degrees   Possible Melena   - patient was on stool softners, this was d/cd   - fecal occult card is pending   Thank you for allowing me to participate in the care of this patient.    Patient/Family are satisfied with care plan and all questions have been answered.   This document was prepared using Dragon voice recognition software and may include unintentional dictation errors.     Vida Rigger, M.D.  Division of Pulmonary & Critical Care Medicine  [facility]

## 2019-11-23 NOTE — Progress Notes (Signed)
PROGRESS NOTE    Tanyah Debruyne  PJS:[id number] DOB: 1922/11/14 DOA: 11/20/2019 PCP: Gracelyn Nurse, MD    Brief Narrative:  Nastashia Gallo is a 86 y.o. female  History of CAD, hypertension, sick sinus syndrome status post pacemaker, CVA presented with cough, sore throat, SOB. In the ED, patient was found temperature 97.6, heart rate 93-1 1 2, respiratory rate 26, blood pressure 165/83, oxygen saturation 94%. Labs shows glucose 146, otherwise unremarkable CMP.  Procalcitonin less than 0.1, WBC 22.3, hemoglobin 16.4, HCT 47.8, neutrophils 86%.  Covid rapid antigen negative.  Covid and flu test negative. .     Consultants:   Pulmonary  Procedures: none  Antimicrobials:   Ceftriaxone and azithro 2/7  Unasyn 2/7...>   Subjective: Patient feeling little better still requiring high flow oxygen.  Not at baseline.  Has no new complaints  Objective: Vitals:   11/23/19 1148 11/23/19 1344 11/23/19 1345 11/23/19 1354  BP: 117/63 (!) 142/58 (!) 142/58 (!) 123/57  Pulse: 63 79 68   Resp: 18     Temp: 97.7 F (36.5 C) (!) 97.4 F (36.3 C) (!) 97.4 F (36.3 C)   TempSrc: Oral Oral Oral   SpO2: 100% 100% 100%   Weight:      Height:        Intake/Output Summary (Last 24 hours) at 11/23/2019 1547 Last data filed at 11/23/2019 1526 Gross per 24 hour  Intake 680 ml  Output 650 ml  Net 30 ml   Filed Weights   11/20/19 1313 11/20/19 2020  Weight: 54.4 kg 53.3 kg    Examination:  General exam: Appears calm and comfortable, nad Respiratory system: minimal expiratory wheezing, no crackles Cardiovascular system: S1 & S2 heard, RRR. No murmurs, rubs, gallops or clicks.  Gastrointestinal system: Abdomen is nondistended, soft and nontender.  Normal bowel sounds heard. Central nervous system: Alert and awake x3.  Grossly intact Extremities: No edema or cyanosis Skin: Warm dry Psychiatry: Mood & affect appropriate in current setting.     Data Reviewed: I have personally  reviewed following labs and imaging studies  CBC: Recent Labs  Lab 11/20/19 1359 11/21/19 0605 11/22/19 0624 11/23/19 0900  WBC 22.3* 29.1* 16.6* 11.5*  NEUTROABS 19.2* 24.3*  --   --   HGB 16.4* 14.6 12.7 12.3  HCT 47.8* 42.8 37.4 37.4  MCV 95.6 96.0 96.6 97.7  PLT 314 265 223 222   Basic Metabolic Panel: Recent Labs  Lab 11/20/19 1359 11/21/19 0605 11/22/19 0624 11/23/19 0229  NA 135 135  --   --   K 3.9 3.7  --   --   CL 100 100  --   --   CO2 25 26  --   --   GLUCOSE 146* 127*  --   --   BUN 19 23  --   --   CREATININE 0.84 1.02* 1.16* 1.15*  CALCIUM 9.5 8.8*  --   --    GFR: Estimated Creatinine Clearance: 22.6 mL/min (A) (by C-G formula based on SCr of 1.15 mg/dL (H)). Liver Function Tests: Recent Labs  Lab 11/20/19 1359  AST 35  ALT 27  ALKPHOS 98  BILITOT 0.9  PROT 8.5*  ALBUMIN 4.3   No results for input(s): LIPASE, AMYLASE in the last 168 hours. No results for input(s): AMMONIA in the last 168 hours. Coagulation Profile: No results for input(s): INR, PROTIME in the last 168 hours. Cardiac Enzymes: No results for input(s): CKTOTAL, CKMB, CKMBINDEX, TROPONINI in  the last 168 hours. BNP (last 3 results) No results for input(s): PROBNP in the last 8760 hours. HbA1C: No results for input(s): HGBA1C in the last 72 hours. CBG: No results for input(s): GLUCAP in the last 168 hours. Lipid Profile: No results for input(s): CHOL, HDL, LDLCALC, TRIG, CHOLHDL, LDLDIRECT in the last 72 hours. Thyroid Function Tests: No results for input(s): TSH, T4TOTAL, FREET4, T3FREE, THYROIDAB in the last 72 hours. Anemia Panel: No results for input(s): VITAMINB12, FOLATE, FERRITIN, TIBC, IRON, RETICCTPCT in the last 72 hours. Sepsis Labs: Recent Labs  Lab 11/20/19 1400  PROCALCITON <0.10    Recent Results (from the past 240 hour(s))  Respiratory Panel by RT PCR (Flu A&B, Covid) - Nasopharyngeal Swab     Status: None   Collection Time: 11/20/19  3:21 PM    Specimen: Nasopharyngeal Swab  Result Value Ref Range Status   SARS Coronavirus 2 by RT PCR NEGATIVE NEGATIVE Final    Comment: (NOTE) SARS-CoV-2 target nucleic acids are NOT DETECTED. The SARS-CoV-2 RNA is generally detectable in upper respiratoy specimens during the acute phase of infection. The lowest concentration of SARS-CoV-2 viral copies this assay can detect is 131 copies/mL. A negative result does not preclude SARS-Cov-2 infection and should not be used as the sole basis for treatment or other patient management decisions. A negative result may occur with  improper specimen collection/handling, submission of specimen other than nasopharyngeal swab, presence of viral mutation(s) within the areas targeted by this assay, and inadequate number of viral copies (<131 copies/mL). A negative result must be combined with clinical observations, patient history, and epidemiological information. The expected result is Negative. Fact Sheet for Patients:  PinkCheek.be Fact Sheet for Healthcare Providers:  GravelBags.it This test is not yet ap proved or cleared by the [place] FDA and  has been authorized for detection and/or diagnosis of SARS-CoV-2 by FDA under an Emergency Use Authorization (EUA). This EUA will remain  in effect (meaning this test can be used) for the duration of the COVID-19 declaration under Section 564(b)(1) of the Act, 21 U.S.C. section 360bbb-3(b)(1), unless the authorization is terminated or revoked sooner.    Influenza A by PCR NEGATIVE NEGATIVE Final   Influenza B by PCR NEGATIVE NEGATIVE Final    Comment: (NOTE) The Xpert Xpress SARS-CoV-2/FLU/RSV assay is intended as an aid in  the diagnosis of influenza from Nasopharyngeal swab specimens and  should not be used as a sole basis for treatment. Nasal washings and  aspirates are unacceptable for Xpert Xpress SARS-CoV-2/FLU/RSV  testing. Fact Sheet  for Patients: PinkCheek.be Fact Sheet for Healthcare Providers: GravelBags.it This test is not yet approved or cleared by the [place] FDA and  has been authorized for detection and/or diagnosis of SARS-CoV-2 by  FDA under an Emergency Use Authorization (EUA). This EUA will remain  in effect (meaning this test can be used) for the duration of the  Covid-19 declaration under Section 564(b)(1) of the Act, 21  U.S.C. section 360bbb-3(b)(1), unless the authorization is  terminated or revoked. Performed at [facility], [address] [place], [place] [address]   Urine culture     Status: None   Collection Time: 11/20/19  7:01 PM   Specimen: Urine, Random  Result Value Ref Range Status   Specimen Description   Final    URINE, RANDOM Performed at [facility], [address] [place], [place] [address]    Special Requests   Final    NONE Performed at [facility], [address], [place], [place] [address]    Culture   Final    NO GROWTH Performed at [facility], [address], [place], [place] [address]    Report Status 11/22/2019 FINAL  Final         Radiology Studies: DG Chest Port 1 View  Result Date: 11/23/2019 CLINICAL DATA:  Pulmonary disease. EXAM: PORTABLE CHEST 1 VIEW COMPARISON:  11/20/2019 FINDINGS: The cardiac silhouette, mediastinal and hilar contours are within normal limits and stable. Mild tortuosity and calcification of the thoracic aorta. Right atrial and right ventricular pacer wires in good position, unchanged. No acute pulmonary findings. No infiltrates, edema or effusions. No pulmonary lesions. IMPRESSION: No acute cardiopulmonary findings. Electronically Signed   By: Rudie Meyer M.D.   On: 11/23/2019 15:02        Scheduled Meds: . ALPRAZolam  0.125 mg Oral q morning - 10a  . ALPRAZolam  0.25 mg Oral QHS  . amLODipine  5 mg Oral Daily  . aspirin EC   81 mg Oral Daily  . docusate sodium  100 mg Oral Daily  . fluticasone  2 spray Each Nare Daily  . heparin  5,000 Units Subcutaneous Q8H  . hydrochlorothiazide  12.5 mg Oral Daily  . latanoprost  1 drop Both Eyes QHS  . mouth rinse  15 mL Mouth Rinse BID  . pantoprazole  40 mg Oral Daily  . sodium chloride HYPERTONIC  4 mL Nebulization BID  . timolol  1 drop Both Eyes Daily   Continuous Infusions: . ampicillin-sulbactam (UNASYN) IV 3 g (11/23/19 1210)    Assessment & Plan:   Principal Problem:   SOB (shortness of breath) Active Problems:   Leukocytosis   Pneumonia   Hypoxia   #SOB, cough, sore throat/hypoxia: Ordered CT scan see results-concern for aspiration PNA. Wbc improving Will change abx to zosyn and continue azithro D/c ceftrixone Discussed with nursing about diligent suctioning Procalcitonin less than 0.1, Flu and Covid test negative Asp. Risk precautions Npo except meds Swallowing evaluation -mech soft diet. Strict reflux precaution HOB elevated Ddimer elevated- ordered CT chest ..>neg. PE. See results. Start protonix 40mg  qd Pulmonary consulted for CT findings- rec. switch antibiotics to Unasyn as mostly likely etiology is aspiration pneumonia at this time Respiratory cultures pending hypertonic saline to induce cough for bronchopulmonary hygiene Checked Legionella since patient had watery stool although I stopped her stool softeners yesterday  #Leukocytosis:  Etiology likely bronchitis/asp pneumonia, see above, improving with iv zosyn, but switch to Unasyn due to above by pulmonary ucx negative  #History of CAD, hypertension, sick sinus syndrome status post pacemaker, CVA: Continue home medications if appropriate follow-up with his PCP, specialist when necessary   Code Status: DNR- spoke to daugther who wants pt to be DNR. DVT Prophylaxis: heparin Family Communication:none Disposition Plan: We will likely be inpatient 1-2 more  midnight days until from pulmonary standpoint will try to wean down on oxygen. PT/OT evaluation .       LOS: 3 days   Time spent: 45 minutes with more than 50% COC    Charise Killian, MD [facility] Pager [phone]  If 7PM-7AM, please contact night-coverage [email or web address] Password [facility] 11/23/2019, 3:47 PM Patient ID: Marissa Weaver, female   DOB: [birth year], 86 y.o.   MRN: [id number]

## 2019-11-23 NOTE — Evaluation (Signed)
Physical Therapy Evaluation Patient Details Name: Brooke Potts MRN: [id number] DOB: 10/02/1923 Today's Date: 11/23/2019   History of Present Illness  Pt is a 86 y.o. female with a PMH of CAD, hypertension, sick sinus syndrome status post pacemaker, CVA. Pt presented with cough, sore throat, and SOB.    Clinical Impression  Pt pleasant and motivated to participate during the session. Pt was found on 5L O2/min with an oxygen saturation of 100%. Throughout the session, pt's O2 stayed WNL and was consistently at 99% or 100%. With verbal cueing and positive encouragement, patient is able to complete bed mobility and basic transfers with CGA and occasional min assist. Upon standing up from EOB, pt reported feeling dizzy but stated that she has vertigo and the symptoms were normal for her. After ambulating 16 feet, pt reported increased fatigue and feel light-headed in addition to her baseline symptoms of feeling dizzy. Pt's BP was checked at this time with a reading of 145/65mmHg. Nsg was notified about symptoms and BP. After sitting for several minutes, patient reported feeling a little bit better but still had complaints of feeling light-headed. Pt will benefit from HHPT services upon discharge to safely address deficits listed in patient problem list for decreased caregiver assistance and eventual return to PLOF.     Follow Up Recommendations Home health PT    Equipment Recommendations  Rolling walker with 5" wheels;3in1 (PT)    Recommendations for Other Services       Precautions / Restrictions Precautions Precautions: Fall Restrictions Weight Bearing Restrictions: No      Mobility  Bed Mobility Overal bed mobility: Needs Assistance Bed Mobility: Supine to Sit     Supine to sit: Min assist        Transfers Overall transfer level: Needs assistance Equipment used: Rolling walker (2 wheeled) Transfers: Sit to/from Stand Sit to Stand: Min guard             Ambulation/Gait Ambulation/Gait assistance: Min guard Gait Distance (Feet): 16 Feet Assistive device: Rolling walker (2 wheeled) Gait Pattern/deviations: Step-through pattern;Decreased stride length;Trunk flexed     General Gait Details: decreased  Stairs            Wheelchair Mobility    Modified Rankin (Stroke Patients Only)       Balance Overall balance assessment: Needs assistance Sitting-balance support: No upper extremity supported Sitting balance-Leahy Scale: Good     Standing balance support: Bilateral upper extremity supported Standing balance-Leahy Scale: Fair                               Pertinent Vitals/Pain      Home Living Family/patient expects to be discharged to:: Private residence Living Arrangements: Children Available Help at Discharge: Family   Home Access: Stairs to enter Entrance Stairs-Rails: None Entrance Stairs-Number of Steps: 1 Home Layout: Two level;Bed/bath upstairs Home Equipment: Grab bars - toilet;Walker - 4 wheels      Prior Function Level of Independence: Independent with assistive device(s)         Comments: community ambulater with 4 wheel walker, assistance with adl's from daughter     Hand Dominance        Extremity/Trunk Assessment        Lower Extremity Assessment Lower Extremity Assessment: Generalized weakness       Communication   Communication: Other (comment)(pt had difficulty hearing. Required increased volume of speech)  Cognition Arousal/Alertness: Awake/alert Behavior During Therapy: [facility]  for tasks assessed/performed Overall Cognitive Status: Within Functional Limits for tasks assessed                                        General Comments      Exercises Total Joint Exercises Ankle Circles/Pumps: AROM;Strengthening;Both;10 reps Quad Sets: Strengthening;Both;10 reps Long Arc Quad: AROM;Strengthening;Both;10 reps Marching in Standing:  AROM;Strengthening;Both;10 reps   Assessment/Plan    PT Assessment Patient needs continued PT services  PT Problem List Decreased strength;Decreased mobility;Decreased range of motion;Decreased activity tolerance;Cardiopulmonary status limiting activity;Decreased balance;Decreased knowledge of use of DME       PT Treatment Interventions DME instruction;Therapeutic exercise;Gait training;Balance training;Stair training;Functional mobility training;Therapeutic activities    PT Goals (Current goals can be found in the Care Plan section)  Acute Rehab PT Goals Patient Stated Goal: get stronger PT Goal Formulation: With patient Time For Goal Achievement: 12/06/19 Potential to Achieve Goals: Good    Frequency Min 2X/week   Barriers to discharge        Co-evaluation               AM-PAC PT "6 Clicks" Mobility  Outcome Measure Help needed turning from your back to your side while in a flat bed without using bedrails?: A Little Help needed moving from lying on your back to sitting on the side of a flat bed without using bedrails?: A Little Help needed moving to and from a bed to a chair (including a wheelchair)?: A Little Help needed standing up from a chair using your arms (e.g., wheelchair or bedside chair)?: A Little Help needed to walk in hospital room?: A Little Help needed climbing 3-5 steps with a railing? : Total 6 Click Score: 16    End of Session Equipment Utilized During Treatment: Gait belt Activity Tolerance: Patient tolerated treatment well Patient left: in chair;with call bell/phone within reach;with chair alarm set Nurse Communication: Mobility status PT Visit Diagnosis: Unsteadiness on feet (R26.81);Muscle weakness (generalized) (M62.81);Difficulty in walking, not elsewhere classified (R26.2)    Time: 2952-8413 PT Time Calculation (min) (ACUTE ONLY): 41 min   Charges:              Annabelle Harman, SPT 11/23/19 1:05 PM

## 2019-11-24 LAB — BASIC METABOLIC PANEL
Anion gap: 10 (ref 5–15)
BUN: 21 mg/dL (ref 8–23)
CO2: 26 mmol/L (ref 22–32)
Calcium: 8.8 mg/dL — ABNORMAL LOW (ref 8.9–10.3)
Chloride: 102 mmol/L (ref 98–111)
Creatinine, Ser: 0.91 mg/dL (ref 0.44–1.00)
GFR calc Af Amer: >60 mL/min (ref >60)
GFR calc non Af Amer: 54 mL/min — ABNORMAL LOW (ref >60)
Glucose, Bld: 115 mg/dL — ABNORMAL HIGH (ref 70–99)
Potassium: 3.5 mmol/L (ref 3.5–5.1)
Sodium: 138 mmol/L (ref 135–145)

## 2019-11-24 LAB — OCCULT BLOOD X 1 CARD TO LAB, STOOL: Fecal Occult Bld: NEGATIVE

## 2019-11-24 LAB — CBC
HCT: 36.9 % (ref 36.0–46.0)
Hemoglobin: 12.4 g/dL (ref 12.0–15.0)
MCH: 32.4 pg (ref 26.0–34.0)
MCHC: 33.6 g/dL (ref 30.0–36.0)
MCV: 96.3 fL (ref 80.0–100.0)
Platelets: 227 10*3/uL (ref 150–400)
RBC: 3.83 MIL/uL — ABNORMAL LOW (ref 3.87–5.11)
RDW: 13.2 % (ref 11.5–15.5)
WBC: 9 10*3/uL (ref 4.0–10.5)
nRBC: 0 % (ref 0.0–0.2)

## 2019-11-24 NOTE — Plan of Care (Signed)
Patient refused bed alarm. Patient educated on safety and purposes of alarm. Will continue to monitor.

## 2019-11-24 NOTE — Progress Notes (Signed)
PROGRESS NOTE    Brooke Potts  SWN:[id number] DOB: 31-Dec-1922 DOA: 11/20/2019 PCP: Gracelyn Nurse, MD    Brief Narrative:  Brooke Potts is a 86 y.o. female  History of CAD, hypertension, sick sinus syndrome status post pacemaker, CVA presented with cough, sore throat, SOB. In the ED, patient was found temperature 97.6, heart rate 93-1 1 2, respiratory rate 26, blood pressure 165/83, oxygen saturation 94%. Labs shows glucose 146, otherwise unremarkable CMP.  Procalcitonin less than 0.1, WBC 22.3, hemoglobin 16.4, HCT 47.8, neutrophils 86%.  Covid rapid antigen negative.  Covid and flu test negative. .     Consultants:   Pulmonary  Procedures: none  Antimicrobials:   Ceftriaxone and azithro 2/7  Unasyn 2/7...>   Subjective: Patient reports not feeling well today.  Her diarrhea stopped and has soft stool however reports is very dark.  She believes her shortness of breath has improved.  No other new complaints.  Objective: Vitals:   11/23/19 2058 11/24/19 0419 11/24/19 0731 11/24/19 1237  BP:  (!) 140/53  (!) 148/53  Pulse:  77  92  Resp:  18    Temp:  98.2 F (36.8 C)  97.8 F (36.6 C)  TempSrc:  Oral  Oral  SpO2: 100% 97% 97% 100%  Weight:      Height:        Intake/Output Summary (Last 24 hours) at 11/24/2019 1327 Last data filed at 11/24/2019 1240 Gross per 24 hour  Intake 240 ml  Output 1250 ml  Net -1010 ml   Filed Weights   11/20/19 1313 11/20/19 2020  Weight: 54.4 kg 53.3 kg    Examination:  General exam: Appears calm and comfortable Respiratory system: more Clear to auscultation with minimal expiratory wheeze, no crackles Cardiovascular system: S1 & S2 heard, RRR. No murmurs, rubs, gallops or clicks.  Gastrointestinal system: Abdomen is nondistended, soft and nontender.  Normal bowel sounds heard. Central nervous system: Alert and awake x3.  Grossly intact Extremities: No edema or cyanosis Skin: Warm dry Psychiatry: Mood & affect  appropriate in current setting.     Data Reviewed: I have personally reviewed following labs and imaging studies  CBC: Recent Labs  Lab 11/20/19 1359 11/21/19 0605 11/22/19 0624 11/23/19 0900 11/24/19 0829  WBC 22.3* 29.1* 16.6* 11.5* 9.0  NEUTROABS 19.2* 24.3*  --   --   --   HGB 16.4* 14.6 12.7 12.3 12.4  HCT 47.8* 42.8 37.4 37.4 36.9  MCV 95.6 96.0 96.6 97.7 96.3  PLT 314 265 223 222 227   Basic Metabolic Panel: Recent Labs  Lab 11/20/19 1359 11/21/19 0605 11/22/19 0624 11/23/19 0229 11/24/19 0829  NA 135 135  --   --  138  K 3.9 3.7  --   --  3.5  CL 100 100  --   --  102  CO2 25 26  --   --  26  GLUCOSE 146* 127*  --   --  115*  BUN 19 23  --   --  21  CREATININE 0.84 1.02* 1.16* 1.15* 0.91  CALCIUM 9.5 8.8*  --   --  8.8*   GFR: Estimated Creatinine Clearance: 28.5 mL/min (by C-G formula based on SCr of 0.91 mg/dL). Liver Function Tests: Recent Labs  Lab 11/20/19 1359  AST 35  ALT 27  ALKPHOS 98  BILITOT 0.9  PROT 8.5*  ALBUMIN 4.3   No results for input(s): LIPASE, AMYLASE in the last 168 hours. No results for  input(s): AMMONIA in the last 168 hours. Coagulation Profile: No results for input(s): INR, PROTIME in the last 168 hours. Cardiac Enzymes: No results for input(s): CKTOTAL, CKMB, CKMBINDEX, TROPONINI in the last 168 hours. BNP (last 3 results) No results for input(s): PROBNP in the last 8760 hours. HbA1C: No results for input(s): HGBA1C in the last 72 hours. CBG: No results for input(s): GLUCAP in the last 168 hours. Lipid Profile: No results for input(s): CHOL, HDL, LDLCALC, TRIG, CHOLHDL, LDLDIRECT in the last 72 hours. Thyroid Function Tests: No results for input(s): TSH, T4TOTAL, FREET4, T3FREE, THYROIDAB in the last 72 hours. Anemia Panel: No results for input(s): VITAMINB12, FOLATE, FERRITIN, TIBC, IRON, RETICCTPCT in the last 72 hours. Sepsis Labs: Recent Labs  Lab 11/20/19 1400  PROCALCITON <0.10    Recent Results (from  the past 240 hour(s))  Respiratory Panel by RT PCR (Flu A&B, Covid) - Nasopharyngeal Swab     Status: None   Collection Time: 11/20/19  3:21 PM   Specimen: Nasopharyngeal Swab  Result Value Ref Range Status   SARS Coronavirus 2 by RT PCR NEGATIVE NEGATIVE Final    Comment: (NOTE) SARS-CoV-2 target nucleic acids are NOT DETECTED. The SARS-CoV-2 RNA is generally detectable in upper respiratoy specimens during the acute phase of infection. The lowest concentration of SARS-CoV-2 viral copies this assay can detect is 131 copies/mL. A negative result does not preclude SARS-Cov-2 infection and should not be used as the sole basis for treatment or other patient management decisions. A negative result may occur with  improper specimen collection/handling, submission of specimen other than nasopharyngeal swab, presence of viral mutation(s) within the areas targeted by this assay, and inadequate number of viral copies (<131 copies/mL). A negative result must be combined with clinical observations, patient history, and epidemiological information. The expected result is Negative. Fact Sheet for Patients:  https://www.moore.com/ Fact Sheet for Healthcare Providers:  https://www.young.biz/ This test is not yet ap proved or cleared by the [place] FDA and  has been authorized for detection and/or diagnosis of SARS-CoV-2 by FDA under an Emergency Use Authorization (EUA). This EUA will remain  in effect (meaning this test can be used) for the duration of the COVID-19 declaration under Section 564(b)(1) of the Act, 21 U.S.C. section 360bbb-3(b)(1), unless the authorization is terminated or revoked sooner.    Influenza A by PCR NEGATIVE NEGATIVE Final   Influenza B by PCR NEGATIVE NEGATIVE Final    Comment: (NOTE) The Xpert Xpress SARS-CoV-2/FLU/RSV assay is intended as an aid in  the diagnosis of influenza from Nasopharyngeal swab specimens and  should  not be used as a sole basis for treatment. Nasal washings and  aspirates are unacceptable for Xpert Xpress SARS-CoV-2/FLU/RSV  testing. Fact Sheet for Patients: https://www.moore.com/ Fact Sheet for Healthcare Providers: https://www.young.biz/ This test is not yet approved or cleared by the [place] FDA and  has been authorized for detection and/or diagnosis of SARS-CoV-2 by  FDA under an Emergency Use Authorization (EUA). This EUA will remain  in effect (meaning this test can be used) for the duration of the  Covid-19 declaration under Section 564(b)(1) of the Act, 21  U.S.C. section 360bbb-3(b)(1), unless the authorization is  terminated or revoked. Performed at [facility], [address] [place], [place] [address]   Urine culture     Status: None   Collection Time: 11/20/19  7:01 PM   Specimen: Urine, Random  Result Value Ref Range Status   Specimen Description   Final  URINE, RANDOM Performed at [facility], [address] [place], [place] [address]    Special Requests   Final    NONE Performed at [facility], [address], [place], [place] [address]    Culture   Final    NO GROWTH Performed at [facility], [place] [address], [place], [place] [address]    Report Status 11/22/2019 FINAL  Final         Radiology Studies: DG Chest Port 1 View  Result Date: 11/23/2019 CLINICAL DATA:  Pulmonary disease. EXAM: PORTABLE CHEST 1 VIEW COMPARISON:  11/20/2019 FINDINGS: The cardiac silhouette, mediastinal and hilar contours are within normal limits and stable. Mild tortuosity and calcification of the thoracic aorta. Right atrial and right ventricular pacer wires in good position, unchanged. No acute pulmonary findings. No infiltrates, edema or effusions. No pulmonary lesions. IMPRESSION: No acute cardiopulmonary findings. Electronically Signed   By: Marijo Sanes M.D.   On: 11/23/2019 15:02         Scheduled Meds: . ALPRAZolam  0.125 mg Oral q morning - 10a  . ALPRAZolam  0.25 mg Oral QHS  . amLODipine  5 mg Oral Daily  . aspirin EC  81 mg Oral Daily  . docusate sodium  100 mg Oral Daily  . fluticasone  2 spray Each Nare Daily  . heparin  5,000 Units Subcutaneous Q8H  . hydrochlorothiazide  12.5 mg Oral Daily  . latanoprost  1 drop Both Eyes QHS  . mouth rinse  15 mL Mouth Rinse BID  . pantoprazole  40 mg Oral Daily  . sodium chloride HYPERTONIC  4 mL Nebulization BID  . timolol  1 drop Both Eyes Daily   Continuous Infusions: . ampicillin-sulbactam (UNASYN) IV 3 g (11/24/19 0101)    Assessment & Plan:   Principal Problem:   SOB (shortness of breath) Active Problems:   Leukocytosis   Pneumonia   Hypoxia   #SOB, cough, sore throat/hypoxia: Ordered CT scan see results-concern for aspiration PNA. Wbc improving Will change abx to zosyn and continue azithro D/c'd ceftrixone Procalcitonin less than 0.1, Flu and Covid test negative Asp. Risk precautions Swallowing evaluation -mech soft diet. Strict reflux precaution HOB elevated Respiratory cx in process Ddimer elevated- ordered CT chest ..>neg. PE. See results. Continue protonix 40mg  qd Pulmonary consulted for CT findings- rec. switch antibiotics to Unasyn as mostly likely etiology is aspiration pneumonia at this time hypertonic saline to induce cough for bronchopulmonary hygiene Checked Legionella since patient had watery stool although stool softeners were discontinued.  Now with dark stool concern for bleeding since H&H dropped mildly. Stool occult pending we will follow up- told nursing about collecting this Plan: Ok to dc from [place] stand point, needs f/u within 7d follow up with pulmonary on outpatient basis.  D/c on  Augmentin 875/125 for aspiration -to finish total 7-day course Wean off oxygen   #Leukocytosis:  Etiology likely bronchitis/asp pneumonia, see above, improving with iv zosyn,  but switch to Unasyn due to above by pulmonary ucx negative  #History of CAD, hypertension, sick sinus syndrome status post pacemaker, CVA: Continue home medications if appropriate follow-up with his PCP, specialist when necessary   Code Status: DNR- spoke to daugther Benjie Karvonen who wants pt to be DNR. DVT Prophylaxis: heparin Family Communication:none Disposition Plan: will wean off oxygen, d/c in am if h/h stable and stool occult negative. Needs to complete 7 day abx course as stated above. Ck am h/h as pt planing of  dark stool concerning for bleed      LOS: 4 days   Time spent: 45 minutes with more than 50% COC    Lynn Ito, MD [facility] Pager [phone]  If 7PM-7AM, please contact night-coverage [email or web address] Password [email or web address] 11/24/2019, 1:27 PM Patient ID: Brooke Potts, female   DOB: December 22, 1922, 86 y.o.   MRN: [id number]

## 2019-11-24 NOTE — Progress Notes (Signed)
Stool sample sent to lab.   Meilani Edmundson D Nikiesha Milford, RN  

## 2019-11-24 NOTE — Progress Notes (Signed)
Pulmonary Medicine          Date: 11/24/2019,   MRN# [id number] Brooke Potts [birth year]     AdmissionWeight: 54.4 kg                 CurrentWeight: 53.3 kg  Referring physician Dr. Marylu Lund    CHIEF COMPLAINT:   Recurrent aspiration pneumonia   SUBJECTIVE   Patient is clinically improved.   She did well with PT today.   Her leukocytosis has resolved, AKI has resolved, breathing and sore throat improved, CXR repeat is clear.  She is cleared for d/c home from pulmonary.  May continue Augmentin 875/125 for aspiration - goal to finish total 7d course.    PAST MEDICAL HISTORY   Past Medical History:  Diagnosis Date  . Coronary artery disease   . Dysrhythmia   . Hypertension   . Pacemaker   . Stroke [facility])      SURGICAL HISTORY   Past Surgical History:  Procedure Laterality Date  . COLONOSCOPY WITH PROPOFOL N/A 12/11/2016   Procedure: COLONOSCOPY WITH PROPOFOL;  Surgeon: Wyline Mood, MD;  Location: [facility] ENDOSCOPY;  Service: Gastroenterology;  Laterality: N/A;  . ESOPHAGOGASTRODUODENOSCOPY N/A 12/09/2016   Procedure: ESOPHAGOGASTRODUODENOSCOPY (EGD);  Surgeon: Toney Reil, MD;  Location: [facility] ENDOSCOPY;  Service: Gastroenterology;  Laterality: N/A;  . PACEMAKER INSERTION       FAMILY HISTORY   Family History  Problem Relation Age of Onset  . Breast cancer Mother 35  . Peripheral vascular disease Mother   . Diabetes Mother   . Stroke Father   . Diabetes Father   . Diabetes Sister   . Diabetes Brother      SOCIAL HISTORY   Social History   Tobacco Use  . Smoking status: Never Smoker  . Smokeless tobacco: Never Used  Substance Use Topics  . Alcohol use: No  . Drug use: No     MEDICATIONS    Home Medication:    Current Medication:  Current Facility-Administered Medications:  .  ALPRAZolam (XANAX) tablet 0.125 mg, 0.125 mg, Oral, q morning - 10a, Rayne Du, MD, 0.125 mg at 11/23/19 1051 .  ALPRAZolam Prudy Feeler) tablet 0.25  mg, 0.25 mg, Oral, QHS, Rayne Du, MD, 0.25 mg at 11/23/19 2129 .  amLODipine (NORVASC) tablet 5 mg, 5 mg, Oral, Daily, Rayne Du, MD, 5 mg at 11/23/19 1052 .  Ampicillin-Sulbactam (UNASYN) 3 g in sodium chloride 0.9 % 100 mL IVPB, 3 g, Intravenous, Q12H, Shanlever, Charmayne Sheer, RPH, Last Rate: 200 mL/hr at 11/24/19 0101, 3 g at 11/24/19 0101 .  aspirin EC tablet 81 mg, 81 mg, Oral, Daily, Rayne Du, MD, 81 mg at 11/23/19 1052 .  docusate sodium (COLACE) capsule 100 mg, 100 mg, Oral, Daily, Rayne Du, MD, 100 mg at 11/22/19 1026 .  fluticasone (FLONASE) 50 MCG/ACT nasal spray 2 spray, 2 spray, Each Nare, Daily, Rayne Du, MD, 2 spray at 11/23/19 1052 .  heparin injection 5,000 Units, 5,000 Units, Subcutaneous, Q8H, Rayne Du, MD, 5,000 Units at 11/24/19 0544 .  hydrALAZINE (APRESOLINE) tablet 10 mg, 10 mg, Oral, Q8H PRN, Rayne Du, MD .  hydrochlorothiazide (MICROZIDE) capsule 12.5 mg, 12.5 mg, Oral, Daily, Karna Christmas, Arlee Bossard, MD, 12.5 mg at 11/23/19 1052 .  latanoprost (XALATAN) 0.005 % ophthalmic solution 1 drop, 1 drop, Both Eyes, QHS, Rayne Du, MD, 1 drop at 11/23/19 2130 .  MEDLINE mouth rinse, 15 mL, Mouth Rinse, BID, Lynn Ito, MD, 15 mL at 11/22/19 2135 .  pantoprazole (PROTONIX) EC tablet 40 mg, 40 mg, Oral, Daily, Kurtis Bushman, Sahar, MD, 40 mg at 11/23/19 1052 .  sodium chloride HYPERTONIC 3 % nebulizer solution 4 mL, 4 mL, Nebulization, BID, Lanney Gins, Quantina Dershem, MD, 4 mL at 11/24/19 0731 .  timolol (TIMOPTIC) 0.5 % ophthalmic solution 1 drop, 1 drop, Both Eyes, Daily, Lenna Sciara, MD, 1 drop at 11/23/19 1052    ALLERGIES   Tramadol     REVIEW OF SYSTEMS    Review of Systems:  Gen:  Denies  fever, sweats, chills weigh loss  HEENT: Denies blurred vision, double vision, ear pain, eye pain, hearing loss, nose bleeds, sore throat Cardiac:  No dizziness, chest pain or heaviness, chest tightness,edema Resp:   Reports cough , denies sputum porduction,  shortness of breath,wheezing, hemoptysis,  Gi: Denies swallowing difficulty, stomach pain, nausea or vomiting, diarrhea, constipation, bowel incontinence Gu:  Denies bladder incontinence, burning urine Ext:   Denies Joint pain, stiffness or swelling Skin: Denies  skin rash, easy bruising or bleeding or hives Endoc:  Denies polyuria, polydipsia , polyphagia or weight change Psych:   Denies depression, insomnia or hallucinations   Other:  All other systems negative   VS: BP (!) 140/53 (BP Location: Left Arm)   Pulse 77   Temp 98.2 F (36.8 C) (Oral)   Resp 18   Ht 5\' 1"  (1.549 m)   Wt 53.3 kg   SpO2 97%   BMI 22.20 kg/m      PHYSICAL EXAM    GENERAL:NAD, no fevers, chills, no weakness no fatigue HEAD: Normocephalic, atraumatic.  EYES: Pupils equal, round, reactive to light. Extraocular muscles intact. No scleral icterus.  MOUTH: Moist mucosal membrane. Dentition intact. No abscess noted.  EAR, NOSE, THROAT: Clear without exudates. No external lesions.  NECK: Supple. No thyromegaly. No nodules. No JVD.  PULMONARY: CTAB CARDIOVASCULAR: S1 and S2. Regular rate and rhythm. No murmurs, rubs, or gallops. No edema. Pedal pulses 2+ bilaterally.  GASTROINTESTINAL: Soft, nontender, nondistended. No masses. Positive bowel sounds. No hepatosplenomegaly.  MUSCULOSKELETAL: No swelling, clubbing, or edema. Range of motion full in all extremities.  NEUROLOGIC: Cranial nerves II through XII are intact. No gross focal neurological deficits. Sensation intact. Reflexes intact.  SKIN: No ulceration, lesions, rashes, or cyanosis. Skin warm and dry. Turgor intact.  PSYCHIATRIC: Mood, affect within normal limits. The patient is awake, alert and oriented x 3. Insight, judgment intact.       IMAGING    CT ANGIO CHEST PE W OR WO CONTRAST  Result Date: 11/21/2019 CLINICAL DATA:  Hypoxia, elevated D-dimer EXAM: CT ANGIOGRAPHY CHEST WITH CONTRAST TECHNIQUE: Multidetector CT imaging of the chest was  performed using the standard protocol during bolus administration of intravenous contrast. Multiplanar CT image reconstructions and MIPs were obtained to evaluate the vascular anatomy. CONTRAST:  61mL OMNIPAQUE IOHEXOL 350 MG/ML SOLN COMPARISON:  Same-day chest radiographs. FINDINGS: Cardiovascular: Satisfactory opacification of the pulmonary arteries to the segmental level. No evidence of pulmonary embolism. Cardiomegaly. Three-vessel coronary artery calcifications. No pericardial effusion. Aortic atherosclerosis. Mediastinum/Nodes: No enlarged mediastinal, hilar, or axillary lymph nodes. Fluid in the trachea. Patulous, partially fluid-filled esophagus. Lungs/Pleura: Scattered ground-glass airspace opacities, most conspicuous in the dependent bilateral lower lobes. No pleural effusion or pneumothorax. Upper Abdomen: No acute abnormality. Large gallstone in the partially imaged gallbladder. Musculoskeletal: No chest wall abnormality. No acute or significant osseous findings. Review of the MIP images confirms the above findings. IMPRESSION: 1. Negative examination for pulmonary embolism. 2. Scattered ground-glass airspace opacities, most conspicuous  in the dependent bilateral lower lobes, nonspecific and infectious or inflammatory. 3. Fluid in the trachea, concerning for aspiration or accumulated secretions. 4. Patulous, partially fluid-filled esophagus, which may place the patient at risk for aspiration. 5. Cardiomegaly.  Coronary artery disease. 6. Aortic Atherosclerosis (ICD10-I70.0). Electronically Signed   By: Lauralyn Primes M.D.   On: 11/21/2019 12:42   DG Chest Port 1 View  Result Date: 11/23/2019 CLINICAL DATA:  Pulmonary disease. EXAM: PORTABLE CHEST 1 VIEW COMPARISON:  11/20/2019 FINDINGS: The cardiac silhouette, mediastinal and hilar contours are within normal limits and stable. Mild tortuosity and calcification of the thoracic aorta. Right atrial and right ventricular pacer wires in good position,  unchanged. No acute pulmonary findings. No infiltrates, edema or effusions. No pulmonary lesions. IMPRESSION: No acute cardiopulmonary findings. Electronically Signed   By: Rudie Meyer M.D.   On: 11/23/2019 15:02   DG Chest Port 1 View  Result Date: 11/20/2019 CLINICAL DATA:  Shortness of breath and cough. EXAM: PORTABLE CHEST 1 VIEW COMPARISON:  04/09/2013 FINDINGS: The heart size and pulmonary vascularity are normal considering the AP portable technique. Pacemaker in place. Aortic atherosclerosis. The lungs are clear. No effusions. No pneumothorax. No acute bone abnormality. IMPRESSION: No acute abnormality. Aortic Atherosclerosis (ICD10-I70.0). Electronically Signed   By: Francene Boyers M.D.   On: 11/20/2019 13:40      ASSESSMENT/PLAN   Recurrent aspiration pneumonia -Status post swallow evaluation with findings consistent with aspiration risk. - mechanical soft diet with reflux precautions and breaks as per speech pathology -will switch antibiotics to Unasyn as mostly likely etiology is aspiration pneumonia at this time  -RVP in process - ground glass infiltrate - possible viral LRTI -Unasyn 1.5g q6h -pharmacy consultation for pharmacodynamics Respiratory cultures in process -pt with watery stools - will obtain legionella ab-stool softners stopped  -hypertonic saline to induce cough for bronchopulmonary hygiene -elevated head of bed to 30 degrees -clinically improved - cleared from pulmonary for d/c home with 7d follow up with pulmonary on outpatient basis.     Possible Melena   - patient was on stool softners, this was d/cd   - fecal occult card is pending   Thank you for allowing me to participate in the care of this patient.    Patient/Family are satisfied with care plan and all questions have been answered.   This document was prepared using Dragon voice recognition software and may include unintentional dictation errors.     Vida Rigger, M.D.  Division of  Pulmonary & Critical Care Medicine  [facility]

## 2019-11-24 NOTE — Progress Notes (Signed)
Physical Therapy Treatment Patient Details Name: Brooke Potts MRN: [id number] DOB: 01/08/1923 Today's Date: 11/24/2019    History of Present Illness Pt is a 86 y.o. female with a PMH of CAD, hypertension, sick sinus syndrome status post pacemaker, CVA. Pt presented with cough, sore throat, and SOB.    PT Comments    Pt presented as agitated to start the session and perseverated on her report that nursing was not responding to her and she was thirsty, hungry, and no one had been in today to check on her. Pt was found with multiple leads disconnected for her telemetry, nsg notified and was reconnected during the session. Pt engaged with PT and completed exercises in the bed, but was unable to to ambulate today due to reports of dizziness upon sitting EOB. After sitting EOB for 5 minutes, pt began to report that she was feeling nauseous and needed to lay back down. When pt sat upright, her SPO2 was consistently between 94% and 95% on RA and her BP was 159/68mmHg. Pt reported relief when returned to supine. When PT was leaving, pt began to get out of bed to use the [facility], PT assisted her to maintain safety and patient required min assist during the transfer. Pt would likely have been able to ambulate today but was solely limited by reports of dizziness when sitting EOB. Pt denied request to sit in chair secondary to complaints of dizziness. Pt will benefit from HHPT services upon discharge to safely address deficits listed in patient problem list for decreased caregiver assistance and eventual return to PLOF.   Follow Up Recommendations  Home health PT     Equipment Recommendations  Rolling walker with 5" wheels;3in1 (PT)    Recommendations for Other Services       Precautions / Restrictions Precautions Precautions: Fall Restrictions Weight Bearing Restrictions: No    Mobility  Bed Mobility Overal bed mobility: Needs Assistance Bed Mobility: Supine to Sit     Supine to sit: Min  assist     General bed mobility comments: Min for trunk control  Transfers Overall transfer level: Needs assistance Equipment used: Rolling walker (2 wheeled) Transfers: Sit to/from Stand Sit to Stand: Min guard         General transfer comment: pt demonstrated decreased concentric/eccentric control  Ambulation/Gait Ambulation/Gait assistance: Min guard Gait Distance (Feet): 2 Feet   Gait Pattern/deviations: Step-through pattern;Decreased stride length;Trunk flexed Gait velocity: decreased   General Gait Details: at end of session, pt had to take 3-4 small steps from EOB to [facility]   Stairs             Wheelchair Mobility    Modified Rankin (Stroke Patients Only)       Balance Overall balance assessment: Needs assistance Sitting-balance support: No upper extremity supported Sitting balance-Leahy Scale: Good     Standing balance support: Bilateral upper extremity supported Standing balance-Leahy Scale: Fair Standing balance comment: When pt transferred from EOB to [facility] at the end of the session, pt utilized handheld assist and assist from bed and furniture with the other hand                            Cognition Arousal/Alertness: Awake/alert Behavior During Therapy: [facility] for tasks assessed/performed Overall Cognitive Status: Within Functional Limits for tasks assessed  General Comments: pt perseverated on where nsg was      Exercises Total Joint Exercises Ankle Circles/Pumps: AROM;Strengthening;Both;15 reps Quad Sets: Strengthening;Both;10 reps Short Arc Quad: AROM;Strengthening;Both;10 reps Heel Slides: AROM;Both;10 reps Hip ABduction/ADduction: AROM;Both;10 reps Straight Leg Raises: AAROM;Both;10 reps Long Arc Quad: AROM;Strengthening;Both;10 reps Other Exercises Other Exercises: educated on HEP (completing exercises independantly throughout the day) Other Exercises: bed mobility Other  Exercises: EOB sitting balance    General Comments        Pertinent Vitals/Pain      Home Living                      Prior Function            PT Goals (current goals can now be found in the care plan section) Progress towards PT goals: Progressing toward goals    Frequency    Min 2X/week      PT Plan Current plan remains appropriate    Co-evaluation              AM-PAC PT "6 Clicks" Mobility   Outcome Measure  Help needed turning from your back to your side while in a flat bed without using bedrails?: A Little Help needed moving from lying on your back to sitting on the side of a flat bed without using bedrails?: A Little Help needed moving to and from a bed to a chair (including a wheelchair)?: A Little Help needed standing up from a chair using your arms (e.g., wheelchair or bedside chair)?: A Little Help needed to walk in hospital room?: A Lot Help needed climbing 3-5 steps with a railing? : Total 6 Click Score: 15    End of Session Equipment Utilized During Treatment: Gait belt Activity Tolerance: Patient tolerated treatment well Patient left: Other (comment)(On BSC, nsg notified. Bed was set up with call bell) Nurse Communication: Mobility status;Other (comment)(pt left on BSC, nsg notified) PT Visit Diagnosis: Unsteadiness on feet (R26.81);Muscle weakness (generalized) (M62.81);Difficulty in walking, not elsewhere classified (R26.2)     Time: 5465-6812 PT Time Calculation (min) (ACUTE ONLY): 35 min  Charges:                        Annabelle Harman, SPT 11/24/19 12:07 PM

## 2019-11-24 NOTE — TOC Initial Note (Signed)
Transition of Care [facility]) - Initial/Assessment Note    Patient Details  Name: Brooke Potts MRN: [id number] Date of Birth: 1923/07/13  Transition of Care [facility]) CM/SW Contact:    Candie Chroman, LCSW Phone Number: 11/24/2019, 12:19 PM  Clinical Narrative:  CSW met with patient. No supports at bedside. CSW introduced role and explained that PT recommendations would be discussed. Patient is not interested in home health at this time. She has a cane at home. CSW began to discuss DME recommendations for a rolling walker and bedside commode but patient asked CSW to come back later.      Expected Discharge Plan: Home/Self Care Barriers to Discharge: Continued Medical Work up   Patient Goals and CMS Choice        Expected Discharge Plan and Services Expected Discharge Plan: Home/Self Care       Living arrangements for the past 2 months: Apartment                                      Prior Living Arrangements/Services Living arrangements for the past 2 months: Apartment Lives with:: Adult Children Patient language and need for interpreter reviewed:: Yes Do you feel safe going back to the place where you live?: Yes      Need for Family Participation in Patient Care: Yes (Comment) Care giver support system in place?: Yes (comment) Current home services: DME Criminal Activity/Legal Involvement Pertinent to Current Situation/Hospitalization: No - Comment as needed  Activities of Daily Living Home Assistive Devices/Equipment: Shower chair with back, Environmental consultant (specify type)(chair that takes her up the stairs) ADL Screening (condition at time of admission) Patient's cognitive ability adequate to safely complete daily activities?: No Is the patient deaf or have difficulty hearing?: No Does the patient have difficulty seeing, even when wearing glasses/contacts?: No Does the patient have difficulty concentrating, remembering, or making decisions?: No Patient able to express need  for assistance with ADLs?: Yes Does the patient have difficulty dressing or bathing?: Yes Independently performs ADLs?: No Communication: Independent Dressing (OT): Independent, Needs assistance Is this a change from baseline?: Pre-admission baseline Grooming: Needs assistance Is this a change from baseline?: Pre-admission baseline Feeding: Independent Bathing: Needs assistance Is this a change from baseline?: Pre-admission baseline Toileting: Needs assistance In/Out Bed: Independent Walks in Home: Independent Does the patient have difficulty walking or climbing stairs?: Yes Weakness of Legs: Both Weakness of Arms/Hands: Both  Permission Sought/Granted                  Emotional Assessment Appearance:: Appears stated age Attitude/Demeanor/Rapport: Engaged Affect (typically observed): Appropriate, Calm, Pleasant Orientation: : Oriented to Self, Oriented to Place, Oriented to  Time, Oriented to Situation Alcohol / Substance Use: Not Applicable Psych Involvement: No (comment)  Admission diagnosis:  SOB (shortness of breath) [R06.02] Hypoxia [R09.02] Acute respiratory failure with hypoxia ([place]) [J96.01] Community acquired pneumonia, unspecified laterality [J18.9] Patient Active Problem List   Diagnosis Date Noted  . SOB (shortness of breath) 11/20/2019  . Leukocytosis 11/20/2019  . Pneumonia 11/20/2019  . Hypoxia 11/20/2019  . H/O compression fracture of spine 06/12/2017  . GI bleed 12/05/2016  . Chronic vertigo 05/04/2015  . History of CVA (cerebrovascular accident) 11/01/2014  . Hypercholesterolemia 11/01/2014  . Sick sinus syndrome ([place]) 11/01/2014  . HTN (hypertension) 04/28/2014  . Pacemaker 04/28/2014  . TIA (transient ischemic attack) 04/28/2014  . Atrial fibrillation ([place]) 02/11/2014  PCP:  Baxter Hire, MD Pharmacy:   [id number] [id number]: [phone]:  [phone]    Social Determinants of Health (SDOH) Interventions    Readmission Risk Interventions No flowsheet data found.

## 2019-11-24 NOTE — Progress Notes (Addendum)
Speech Language Pathology Treatment: Dysphagia  Patient Details Name: Brooke Potts MRN: [id number] DOB: [birth year] Today's Date: 11/24/2019 Time: 1450-1520 SLP Time Calculation (min) (ACUTE ONLY): 30 min  Assessment / Plan / Recommendation Clinical Impression  Pt seen today for ongoing assessment of swallowing; toleration of oral diet and follow through w/ general aspiration precautions. Pt was admitted w/ concern for aspiration pneumonia w/ suspicion for Backflow of Material from the Esophagus -- Reflux material. Noted pt's Chest CT on 11/21/2019 revealed: "Patulous, partially fluid-filled esophagus, which may place the patient at risk for aspiration; Fluid in the trachea, concerning for aspiration or accumulated secretions; Scattered ground-glass airspace opacities". ANY Esophageal phase dysmotility can increase risk for Backflow thus risk for aspiration of Reflux material and Pulmonary decline. Noted pt an apparent Mild expiratory wheeze during exertion sitting EOB. NSG endorsed the same today, but No swallowing difficulties w/ po's w/ her. CXR on 11/23/2019: "No acute cardiopulmonary findings". Pt endorsed improved breathing but discomfort w/ bowel movements. MD aware. Pt stated she has "not eaten" at meals today but had an Ensure ordered per NSG to this was provided. Pt took 1 sip then declined further. She was accepting of a "milkshake" made by SLP and consumed the entire ~8ozs. Pt declined any difficulty swallowing; no overt clinical s/s of aspiration noted during/post intake. BELCHING appreciated - suggested pt remain sitting upright post intake for digestion reasons; Reflux precautions. Pt exhibited Mild respiratory changes -- increased expiratory effort during trials. REST BREAKS strongly suggested to lessen WOB/SOB w/ the exertion of oral intake, po trials. Pt followed cues for Rest Breaks w/ verbal cues from SLP. Oral phase was [facility] for bolus management and oral clearing during/post intake. Pt  fed self sitting EOB.  Recommend continue w/ current Mech Soft diet (cut meats, moistened foods for ease of mastication/intake); Thin liquids. General aspiration precautions; REFLUX precautions. Recommend Pills in Puree for safer swallowing d/t advanced age, chronic illness. Time was spent on education w/ pt to include: education pt on general aspiration precautions including small, single sips - Slowly!! Take Rest Breaks in b/t bites/sips to reduce any SOB/WOB from exertion. Recommended moistened foods for ease of mastication; lessen exertion. Recommended pills Whole in puree for safer swallowing d/t quick SOB/WOB w/ exertion of oral intake; advanced age. NSG updated on above; MD on diet recommended. NSG to reconsult of any further needs arise while admitted. Precautions posted in room.       HPI HPI: Pt is a 86 y.o. female w/ h/o of CAD, hypertension, GI bleed, afib, sick sinus syndrome status post pacemaker, CVA in 2016 per chart notes presented with cough, sore throat, SOB.  Chest CT on 11/21/2019 revealed: "Patulous, partially fluid-filled esophagus, which may place the patient at risk for aspiration; Fluid in the trachea, concerning for aspiration or accumulated secretions; Scattered ground-glass airspace opacities, most conspicuous in the dependent bilateral lower lobes, nonspecific and infectious or inflammatory".  Pt endorses inconsistent s/s of Reflux at home prior to hospitalization.       SLP Plan  All goals met       Recommendations  Diet recommendations: Dysphagia 3 (mechanical soft);Thin liquid(for conservation of energy) Liquids provided via: Cup;Straw(monitor) Medication Administration: Whole meds with puree(for safer swallowing d/t illness; weakness and fatigue) Supervision: Patient able to self feed(setup given) Compensations: Minimize environmental distractions;Slow rate;Small sips/bites;Lingual sweep for clearance of pocketing;Multiple dry swallows after each bite/sip;Follow  solids with liquid Postural Changes and/or Swallow Maneuvers: Seated upright 90 degrees;Upright 30-60 min after meal  General recommendations: (Dietician f/u) Oral Care Recommendations: Oral care BID;Oral care before and after PO;Staff/trained caregiver to provide oral care Follow up Recommendations: None SLP Visit Diagnosis: Dysphagia, pharyngoesophageal phase (R13.14) Plan: All goals met       GO                 Brooke Kenner, MS, CCC-SLP Brooke Potts 11/24/2019, 3:42 PM

## 2019-11-24 NOTE — Progress Notes (Signed)
Patient continues to refuse bed alarm. Did encourage patient to call when needing to go to the bathroom. Mat placed at bedside.   Madie Reno, RN

## 2019-11-25 DIAGNOSIS — J690 Pneumonitis due to inhalation of food and vomit: Principal | ICD-10-CM

## 2019-11-25 LAB — HEMOGLOBIN AND HEMATOCRIT, BLOOD
HCT: 36.4 % (ref 36.0–46.0)
Hemoglobin: 12.4 g/dL (ref 12.0–15.0)

## 2019-11-25 MED ORDER — AMOXICILLIN-POT CLAVULANATE 500-125 MG PO TABS: 1.0000 | ORAL_TABLET | Freq: Two times a day (BID) | ORAL | 0 refills | Status: AC

## 2019-11-25 MED ORDER — PANTOPRAZOLE SODIUM 40 MG PO TBEC: 40.0000 mg | DELAYED_RELEASE_TABLET | Freq: Every day | ORAL | 0 refills | Status: DC

## 2019-11-25 MED ORDER — ALBUTEROL SULFATE (2.5 MG/3ML) 0.083% IN NEBU: 2.5000 mg | INHALATION_SOLUTION | Freq: Four times a day (QID) | RESPIRATORY_TRACT | Status: DC

## 2019-11-25 MED ORDER — MUCINEX 600 MG PO TB12: 600.0000 mg | ORAL_TABLET | Freq: Two times a day (BID) | ORAL | 0 refills | Status: AC

## 2019-11-25 MED ORDER — AMOXICILLIN-POT CLAVULANATE 875-125 MG PO TABS: 1.0000 | ORAL_TABLET | Freq: Two times a day (BID) | ORAL | Status: DC

## 2019-11-25 MED ORDER — AMOXICILLIN-POT CLAVULANATE 500-125 MG PO TABS
1.0000 | ORAL_TABLET | Freq: Two times a day (BID) | ORAL | Status: DC
  Filled 2019-11-25 (×2): qty 1

## 2019-11-25 NOTE — TOC Transition Note (Signed)
Transition of Care [facility]) - CM/SW Discharge Note   Patient Details  Name: Ceyda Peterka MRN: [id number] Date of Birth: Jun 17, 1923  Transition of Care [facility]) CM/SW Contact:  Margarito Liner, LCSW Phone Number: 11/25/2019, 1:30 PM   Clinical Narrative:   Patient has orders to discharge home today. DME will be delivered before she leaves. No further concerns. CSW signing off.  Final next level of care: Home/Self Care Barriers to Discharge: Barriers Resolved   Patient Goals and CMS Choice     Choice offered to / list presented to : NA  Discharge Placement                    Patient and family notified of of transfer: 11/25/19  Discharge Plan and Services                DME Arranged: 3-N-1, Walker rolling DME Agency: [employer] Date DME Agency Contacted: 11/25/19   Representative spoke with at DME Agency: Mitchell Heir [facility] Arranged: Patient Refused HH          Social Determinants of Health (SDOH) Interventions     Readmission Risk Interventions No flowsheet data found.

## 2019-11-25 NOTE — Progress Notes (Signed)
Walker and BSC delivered to patient's room for discharge.   Madie Reno, RN

## 2019-11-25 NOTE — TOC Progression Note (Signed)
Transition of Care [facility]) - Progression Note    Patient Details  Name: Brooke Potts MRN: [id number] Date of Birth: 07-07-1923  Transition of Care [facility]) CM/SW [place], LCSW Phone Number: 11/25/2019, 9:17 AM  Clinical Narrative:  CSW met with patient. She continues to declined home health. She is agreeable to DME recommendations for a 3-in-1 and rolling walker. Sent patient's information to [employer] representative to order.    Expected Discharge Plan: Home/Self Care Barriers to Discharge: Continued Medical Work up  Expected Discharge Plan and Services Expected Discharge Plan: Home/Self Care       Living arrangements for the past 2 months: Apartment                                       Social Determinants of Health (SDOH) Interventions    Readmission Risk Interventions No flowsheet data found.

## 2019-11-25 NOTE — Care Management Important Message (Addendum)
Important Message  Patient Details  Name: Brooke Potts MRN: [id number] Date of Birth: Apr 25, 1923   Medicare Important Message Given:  Other (see comment)  Attempted 11/25/19 and 11/24/19 to review Medicare IM with patient and obtain signature as Patient Access unable to do so, however patient asleep both attempts and would not stir upon calling name.  Blank copy of Medicare IM left on patient's try table for reference. Will attempt at later time.     Johnell Comings 11/25/2019, 11:37 AM

## 2019-11-25 NOTE — Progress Notes (Signed)
Brooke Potts to be D/C'd Home per MD order.  Discussed prescriptions and follow up appointments with the patient. Prescriptions given to patient, medication list explained in detail. Pt verbalized understanding.  Allergies as of 11/25/2019      Reactions   Tramadol Nausea And Vomiting, Nausea Only      Medication List    TAKE these medications   ALPRAZolam 0.25 MG tablet Commonly known as: XANAX Take 0.125-0.25 mg by mouth See admin instructions. Take  tablet (0.125mg ) by mouth every morning and take 1 tablet (0.25mg ) by mouth at bedtime   amLODipine 5 MG tablet Commonly known as: NORVASC Take 5 mg by mouth daily.   amoxicillin-clavulanate 500-125 MG tablet Commonly known as: AUGMENTIN Take 1 tablet (500 mg total) by mouth 2 (two) times daily for 1 day.   aspirin EC 81 MG tablet Take 1 tablet (81 mg total) by mouth daily.   docusate sodium 100 MG capsule Commonly known as: COLACE Take 100 mg by mouth daily.   fluticasone 50 MCG/ACT nasal spray Commonly known as: FLONASE Place 2 sprays into both nostrils daily.   latanoprost 0.005 % ophthalmic solution Commonly known as: XALATAN Place 1 drop into both eyes at bedtime.   lisinopril 20 MG tablet Commonly known as: ZESTRIL Take 20 mg by mouth daily.   Mucinex 600 MG 12 hr tablet Generic drug: guaiFENesin Take 1 tablet (600 mg total) by mouth 2 (two) times daily for 7 days.   pantoprazole 40 MG tablet Commonly known as: PROTONIX Take 1 tablet (40 mg total) by mouth daily. Start taking on: November 26, 2019   timolol 0.5 % ophthalmic solution Commonly known as: TIMOPTIC Place 1 drop into both eyes daily.            Durable Medical Equipment  (From admission, onward)         Start     Ordered   11/25/19 0919  For home use only DME Walker rolling  Once    Question Answer Comment  Walker: With 5 Inch Wheels   Patient needs a walker to treat with the following condition Generalized weakness      11/25/19 0918   11/25/19 0919  For home use only DME 3 n 1  Once     11/25/19 0918          Vitals:   11/25/19 0949 11/25/19 1137  BP: 137/84 (!) 151/81  Pulse: 76 75  Resp:    Temp:  97.7 F (36.5 C)  SpO2: 98% 95%    Skin clean, dry and intact without evidence of skin break down, no evidence of skin tears noted. IV catheter discontinued intact. Site without signs and symptoms of complications. Dressing and pressure applied. Pt denies pain at this time. No complaints noted.  An After Visit Summary was printed and given to the patient. Patient escorted via WC, and D/C home via private auto.  Madie Reno, RN

## 2019-11-25 NOTE — Discharge Summary (Signed)
Physician Discharge Summary  Brooke Potts ZTI:[id number] DOB: 28-Jul-1923 DOA: 11/20/2019  PCP: Baxter Hire, MD  Admit date: 11/20/2019 Discharge date: 11/25/2019  Admitted From: Home Disposition:  Home  Recommendations for Outpatient Follow-up:  1. Follow up with PCP in 1-2 weeks 2. Please obtain BMP/CBC in one week 3. Follow up with pulmonology in approximately 1 week    Home Health: No - patient declined   Equipment/Devices: none   Discharge Condition: Stable  CODE STATUS: DNR  Diet recommendation:  Dysphagia level 3 - mechanical soft  Brief/Interim Summary:  Brooke Potts a 86 y.o.femalewith a history of CAD, hypertension, sick sinus syndrome status post pacemaker, CVA presented to the ED on 2/5 with cough, sore throat, SOB.  In the ED, patient was found temperature 97.6, heart rate 93-112, respiratory rate 26, blood pressure 165/83, oxygen saturation 94%. Labs shows glucose 146, otherwise unremarkable CMP. Procalcitonin less than 0.1, WBC 22.3, hemoglobin 16.4, HCT 47.8, neutrophils 86%. Covid rapid antigen negative. Covid and flu test negative.  Chest xray negative for acute findings, however CTA chest did show bilateral ground-glass opacities consistent with pneumonia, was negative for PE, showed fluid in trachea (aspiration or secretions), and partially fluid-filled esophagus (aspiration risk). Patient was treated for aspiration pneumonia with IV antibiotics.  Pulmonology consulted.  Speech therapy evaluated and recommended dysphagia-3 diet (mechanical soft) and reflux precautions, head of bed elevated.  Patient has been weaned off supplemental oxygen, is clinically improved and is stable for discharge home today.  PT recommended home health physical therapy, but patient declined this service.  She is to complete oral antibiotics and follow up with pulmonary and PCP in about 1 week.  During admission, there was concern for dark stools and possible GI bleeding.   Patient hemoglobin remained stable and stool sent for occult heme was negative.   Discharge Diagnoses: Principal Problem:   SOB (shortness of breath) Active Problems:   Leukocytosis   Pneumonia   Hypoxia    Discharge Instructions   Discharge Instructions    Call MD for:   Complete by: As directed    Worsening shortness of breath   Call MD for:  extreme fatigue   Complete by: As directed    Call MD for:  temperature >100.4   Complete by: As directed    Diet - low sodium heart healthy   Complete by: As directed    Discharge instructions   Complete by: As directed    Take Augmentin (antibiotic) for 1 more day at home to complete full course of treatment for pneumonia.  To prevent reflux and reduce your risk for aspiration, eat meals sitting upright and stay sitting up for at least 30-45 minutes after every meal before laying down.   Increase activity slowly   Complete by: As directed      Allergies as of 11/25/2019      Reactions   Tramadol Nausea And Vomiting, Nausea Only      Medication List    TAKE these medications   ALPRAZolam 0.25 MG tablet Commonly known as: XANAX Take 0.125-0.25 mg by mouth See admin instructions. Take  tablet (0.125mg ) by mouth every morning and take 1 tablet (0.25mg ) by mouth at bedtime   amLODipine 5 MG tablet Commonly known as: NORVASC Take 5 mg by mouth daily.   amoxicillin-clavulanate 500-125 MG tablet Commonly known as: AUGMENTIN Take 1 tablet (500 mg total) by mouth 2 (two) times daily for 1 day.   aspirin EC 81 MG tablet  Take 1 tablet (81 mg total) by mouth daily.   docusate sodium 100 MG capsule Commonly known as: COLACE Take 100 mg by mouth daily.   fluticasone 50 MCG/ACT nasal spray Commonly known as: FLONASE Place 2 sprays into both nostrils daily.   latanoprost 0.005 % ophthalmic solution Commonly known as: XALATAN Place 1 drop into both eyes at bedtime.   lisinopril 20 MG tablet Commonly known as: ZESTRIL Take  20 mg by mouth daily.   Mucinex 600 MG 12 hr tablet Generic drug: guaiFENesin Take 1 tablet (600 mg total) by mouth 2 (two) times daily for 7 days.   pantoprazole 40 MG tablet Commonly known as: PROTONIX Take 1 tablet (40 mg total) by mouth daily. Start taking on: November 26, 2019   timolol 0.5 % ophthalmic solution Commonly known as: TIMOPTIC Place 1 drop into both eyes daily.            Durable Medical Equipment  (From admission, onward)         Start     Ordered   11/25/19 0919  For home use only DME Walker rolling  Once    Question Answer Comment  Walker: With 5 Inch Wheels   Patient needs a walker to treat with the following condition Generalized weakness      11/25/19 0918   11/25/19 0919  For home use only DME 3 n 1  Once     11/25/19 0918          Allergies  Allergen Reactions  . Tramadol Nausea And Vomiting and Nausea Only    Consultations:  Pulmonology, Dr. Karna Christmas    Procedures/Studies: CT ANGIO CHEST PE W OR WO CONTRAST  Result Date: 11/21/2019 CLINICAL DATA:  Hypoxia, elevated D-dimer EXAM: CT ANGIOGRAPHY CHEST WITH CONTRAST TECHNIQUE: Multidetector CT imaging of the chest was performed using the standard protocol during bolus administration of intravenous contrast. Multiplanar CT image reconstructions and MIPs were obtained to evaluate the vascular anatomy. CONTRAST:  76mL OMNIPAQUE IOHEXOL 350 MG/ML SOLN COMPARISON:  Same-day chest radiographs. FINDINGS: Cardiovascular: Satisfactory opacification of the pulmonary arteries to the segmental level. No evidence of pulmonary embolism. Cardiomegaly. Three-vessel coronary artery calcifications. No pericardial effusion. Aortic atherosclerosis. Mediastinum/Nodes: No enlarged mediastinal, hilar, or axillary lymph nodes. Fluid in the trachea. Patulous, partially fluid-filled esophagus. Lungs/Pleura: Scattered ground-glass airspace opacities, most conspicuous in the dependent bilateral lower lobes. No pleural  effusion or pneumothorax. Upper Abdomen: No acute abnormality. Large gallstone in the partially imaged gallbladder. Musculoskeletal: No chest wall abnormality. No acute or significant osseous findings. Review of the MIP images confirms the above findings. IMPRESSION: 1. Negative examination for pulmonary embolism. 2. Scattered ground-glass airspace opacities, most conspicuous in the dependent bilateral lower lobes, nonspecific and infectious or inflammatory. 3. Fluid in the trachea, concerning for aspiration or accumulated secretions. 4. Patulous, partially fluid-filled esophagus, which may place the patient at risk for aspiration. 5. Cardiomegaly.  Coronary artery disease. 6. Aortic Atherosclerosis (ICD10-I70.0). Electronically Signed   By: Lauralyn Primes M.D.   On: 11/21/2019 12:42   DG Chest Port 1 View  Result Date: 11/23/2019 CLINICAL DATA:  Pulmonary disease. EXAM: PORTABLE CHEST 1 VIEW COMPARISON:  11/20/2019 FINDINGS: The cardiac silhouette, mediastinal and hilar contours are within normal limits and stable. Mild tortuosity and calcification of the thoracic aorta. Right atrial and right ventricular pacer wires in good position, unchanged. No acute pulmonary findings. No infiltrates, edema or effusions. No pulmonary lesions. IMPRESSION: No acute cardiopulmonary findings. Electronically Signed   By:  Rudie Meyer M.D.   On: 11/23/2019 15:02   DG Chest Port 1 View  Result Date: 11/20/2019 CLINICAL DATA:  Shortness of breath and cough. EXAM: PORTABLE CHEST 1 VIEW COMPARISON:  04/09/2013 FINDINGS: The heart size and pulmonary vascularity are normal considering the AP portable technique. Pacemaker in place. Aortic atherosclerosis. The lungs are clear. No effusions. No pneumothorax. No acute bone abnormality. IMPRESSION: No acute abnormality. Aortic Atherosclerosis (ICD10-I70.0). Electronically Signed   By: Francene Boyers M.D.   On: 11/20/2019 13:40       Subjective: Patient reports feeling well this AM.   Denies fever/chills, coughing, choking with swallowing.  No acute events reported overnight.  Patient asks if she goes home today.  Discussed PT at home and she again declines.   Discharge Exam: Vitals:   11/25/19 0949 11/25/19 1137  BP: 137/84 (!) 151/81  Pulse: 76 75  Resp:    Temp:  97.7 F (36.5 C)  SpO2: 98% 95%   Vitals:   11/24/19 1958 11/25/19 0334 11/25/19 0949 11/25/19 1137  BP: (!) 161/58 (!) 147/70 137/84 (!) 151/81  Pulse: 75 78 76 75  Resp: 16 16    Temp: 97.7 F (36.5 C) 97.7 F (36.5 C)  97.7 F (36.5 C)  TempSrc: Oral Oral    SpO2: 97% 94% 98% 95%  Weight:      Height:        General: Pt is alert, awake, not in acute distress Cardiovascular: RRR, S1/S2 +, no rubs, no gallops Respiratory: diminished at bases otherwise clear, no wheezing, no rhonchi Abdominal: Soft, NT, ND, bowel sounds + Extremities: no edema, no cyanosis    The results of significant diagnostics from this hospitalization (including imaging, microbiology, ancillary and laboratory) are listed below for reference.     Microbiology: Recent Results (from the past 240 hour(s))  Respiratory Panel by RT PCR (Flu A&B, Covid) - Nasopharyngeal Swab     Status: None   Collection Time: 11/20/19  3:21 PM   Specimen: Nasopharyngeal Swab  Result Value Ref Range Status   SARS Coronavirus 2 by RT PCR NEGATIVE NEGATIVE Final    Comment: (NOTE) SARS-CoV-2 target nucleic acids are NOT DETECTED. The SARS-CoV-2 RNA is generally detectable in upper respiratoy specimens during the acute phase of infection. The lowest concentration of SARS-CoV-2 viral copies this assay can detect is 131 copies/mL. A negative result does not preclude SARS-Cov-2 infection and should not be used as the sole basis for treatment or other patient management decisions. A negative result may occur with  improper specimen collection/handling, submission of specimen other than nasopharyngeal swab, presence of viral mutation(s)  within the areas targeted by this assay, and inadequate number of viral copies (<131 copies/mL). A negative result must be combined with clinical observations, patient history, and epidemiological information. The expected result is Negative. Fact Sheet for Patients:  https://www.moore.com/ Fact Sheet for Healthcare Providers:  https://www.young.biz/ This test is not yet ap proved or cleared by the [place] FDA and  has been authorized for detection and/or diagnosis of SARS-CoV-2 by FDA under an Emergency Use Authorization (EUA). This EUA will remain  in effect (meaning this test can be used) for the duration of the COVID-19 declaration under Section 564(b)(1) of the Act, 21 U.S.C. section 360bbb-3(b)(1), unless the authorization is terminated or revoked sooner.    Influenza A by PCR NEGATIVE NEGATIVE Final   Influenza B by PCR NEGATIVE NEGATIVE Final    Comment: (NOTE) The Xpert Xpress SARS-CoV-2/FLU/RSV assay is  intended as an aid in  the diagnosis of influenza from Nasopharyngeal swab specimens and  should not be used as a sole basis for treatment. Nasal washings and  aspirates are unacceptable for Xpert Xpress SARS-CoV-2/FLU/RSV  testing. Fact Sheet for Patients: https://www.moore.com/ Fact Sheet for Healthcare Providers: https://www.young.biz/ This test is not yet approved or cleared by the [place] FDA and  has been authorized for detection and/or diagnosis of SARS-CoV-2 by  FDA under an Emergency Use Authorization (EUA). This EUA will remain  in effect (meaning this test can be used) for the duration of the  Covid-19 declaration under Section 564(b)(1) of the Act, 21  U.S.C. section 360bbb-3(b)(1), unless the authorization is  terminated or revoked. Performed at [facility], [address] [place], [place] [address]   Urine culture     Status: None   Collection Time:  11/20/19  7:01 PM   Specimen: Urine, Random  Result Value Ref Range Status   Specimen Description   Final    URINE, RANDOM Performed at [facility], [address] [place], [place] [address]    Special Requests   Final    NONE Performed at [facility], [address], [place], [place] [address]    Culture   Final    NO GROWTH Performed at [facility], [address] [place], [place] [address]    Report Status 11/22/2019 FINAL  Final     Labs: BNP (last 3 results) No results for input(s): BNP in the last 8760 hours. Basic Metabolic Panel: Recent Labs  Lab 11/20/19 1359 11/21/19 0605 11/22/19 0624 11/23/19 0229 11/24/19 0829  NA 135 135  --   --  138  K 3.9 3.7  --   --  3.5  CL 100 100  --   --  102  CO2 25 26  --   --  26  GLUCOSE 146* 127*  --   --  115*  BUN 19 23  --   --  21  CREATININE 0.84 1.02* 1.16* 1.15* 0.91  CALCIUM 9.5 8.8*  --   --  8.8*   Liver Function Tests: Recent Labs  Lab 11/20/19 1359  AST 35  ALT 27  ALKPHOS 98  BILITOT 0.9  PROT 8.5*  ALBUMIN 4.3   No results for input(s): LIPASE, AMYLASE in the last 168 hours. No results for input(s): AMMONIA in the last 168 hours. CBC: Recent Labs  Lab 11/20/19 1359 11/20/19 1359 11/21/19 0605 11/22/19 0624 11/23/19 0900 11/24/19 0829 11/25/19 0228  WBC 22.3*  --  29.1* 16.6* 11.5* 9.0  --   NEUTROABS 19.2*  --  24.3*  --   --   --   --   HGB 16.4*   < > 14.6 12.7 12.3 12.4 12.4  HCT 47.8*   < > 42.8 37.4 37.4 36.9 36.4  MCV 95.6  --  96.0 96.6 97.7 96.3  --   PLT 314  --  265 223 222 227  --    < > = values in this interval not displayed.   Cardiac Enzymes: No results for input(s): CKTOTAL, CKMB, CKMBINDEX, TROPONINI in the last 168 hours. BNP: Invalid input(s): POCBNP CBG: No results for input(s): GLUCAP in the last 168 hours. D-Dimer No results for input(s): DDIMER in the last 72 hours. Hgb A1c No results for input(s): HGBA1C in the last 72  hours. Lipid Profile No results for input(s): CHOL, HDL, LDLCALC, TRIG, CHOLHDL, LDLDIRECT in  the last 72 hours. Thyroid function studies No results for input(s): TSH, T4TOTAL, T3FREE, THYROIDAB in the last 72 hours.  Invalid input(s): FREET3 Anemia work up No results for input(s): VITAMINB12, FOLATE, FERRITIN, TIBC, IRON, RETICCTPCT in the last 72 hours. Urinalysis    Component Value Date/Time   COLORURINE YELLOW (A) 11/20/2019 1901   APPEARANCEUR HAZY (A) 11/20/2019 1901   LABSPEC 1.012 11/20/2019 1901   PHURINE 6.0 11/20/2019 1901   GLUCOSEU NEGATIVE 11/20/2019 1901   HGBUR SMALL (A) 11/20/2019 1901   BILIRUBINUR NEGATIVE 11/20/2019 1901   KETONESUR 5 (A) 11/20/2019 1901   PROTEINUR 100 (A) 11/20/2019 1901   NITRITE NEGATIVE 11/20/2019 1901   LEUKOCYTESUR LARGE (A) 11/20/2019 1901   Sepsis Labs Invalid input(s): PROCALCITONIN,  WBC,  LACTICIDVEN Microbiology Recent Results (from the past 240 hour(s))  Respiratory Panel by RT PCR (Flu A&B, Covid) - Nasopharyngeal Swab     Status: None   Collection Time: 11/20/19  3:21 PM   Specimen: Nasopharyngeal Swab  Result Value Ref Range Status   SARS Coronavirus 2 by RT PCR NEGATIVE NEGATIVE Final    Comment: (NOTE) SARS-CoV-2 target nucleic acids are NOT DETECTED. The SARS-CoV-2 RNA is generally detectable in upper respiratoy specimens during the acute phase of infection. The lowest concentration of SARS-CoV-2 viral copies this assay can detect is 131 copies/mL. A negative result does not preclude SARS-Cov-2 infection and should not be used as the sole basis for treatment or other patient management decisions. A negative result may occur with  improper specimen collection/handling, submission of specimen other than nasopharyngeal swab, presence of viral mutation(s) within the areas targeted by this assay, and inadequate number of viral copies (<131 copies/mL). A negative result must be combined with clinical observations, patient  history, and epidemiological information. The expected result is Negative. Fact Sheet for Patients:  https://www.moore.com/ Fact Sheet for Healthcare Providers:  https://www.young.biz/ This test is not yet ap proved or cleared by the [place] FDA and  has been authorized for detection and/or diagnosis of SARS-CoV-2 by FDA under an Emergency Use Authorization (EUA). This EUA will remain  in effect (meaning this test can be used) for the duration of the COVID-19 declaration under Section 564(b)(1) of the Act, 21 U.S.C. section 360bbb-3(b)(1), unless the authorization is terminated or revoked sooner.    Influenza A by PCR NEGATIVE NEGATIVE Final   Influenza B by PCR NEGATIVE NEGATIVE Final    Comment: (NOTE) The Xpert Xpress SARS-CoV-2/FLU/RSV assay is intended as an aid in  the diagnosis of influenza from Nasopharyngeal swab specimens and  should not be used as a sole basis for treatment. Nasal washings and  aspirates are unacceptable for Xpert Xpress SARS-CoV-2/FLU/RSV  testing. Fact Sheet for Patients: https://www.moore.com/ Fact Sheet for Healthcare Providers: https://www.young.biz/ This test is not yet approved or cleared by the [place] FDA and  has been authorized for detection and/or diagnosis of SARS-CoV-2 by  FDA under an Emergency Use Authorization (EUA). This EUA will remain  in effect (meaning this test can be used) for the duration of the  Covid-19 declaration under Section 564(b)(1) of the Act, 21  U.S.C. section 360bbb-3(b)(1), unless the authorization is  terminated or revoked. Performed at [facility], [address] [place], [place] [address]   Urine culture     Status: None   Collection Time: 11/20/19  7:01 PM   Specimen: Urine, Random  Result Value Ref Range Status   Specimen Description   Final    URINE, RANDOM  Performed at [facility], [address] [place], [place] [address]    Special Requests   Final    NONE Performed at [facility], [address] [place], [place] [address]    Culture   Final    NO GROWTH Performed at [facility], [address] [place], [place] [address]    Report Status 11/22/2019 FINAL  Final     Time coordinating discharge: Over 30 minutes  SIGNED:   Pennie BanterKelly A Josey Forcier, DO [facility] 11/25/2019, 1:27 PM   If 7PM-7AM, please contact night-coverage [email or web address]

## 2019-11-25 NOTE — Progress Notes (Signed)
PHARMACY NOTE:  ANTIMICROBIAL RENAL DOSAGE ADJUSTMENT  Current antimicrobial regimen includes a mismatch between antimicrobial dosage and estimated renal function.  As per policy approved by the Pharmacy & Therapeutics and Medical Executive Committees, the antimicrobial dosage will be adjusted accordingly.  Current antimicrobial dosage:  Augmentin 875mg    Indication: Aspiration PNA  Renal Function:  Estimated Creatinine Clearance: 28.5 mL/min (by C-G formula based on SCr of 0.91 mg/dL).     Antimicrobial dosage has been changed to:  Augmentin 500mg  BID based on current CrCl <26ml/min    Thank you for allowing pharmacy to be a part of this patient's care.  , PharmD, BCPS Clinical Pharmacist 11/25/2019 11:44 AM

## 2019-11-25 NOTE — Progress Notes (Signed)
Pulmonary Medicine          Date: 11/25/2019,   MRN# [id number] Brooke Potts 1923/07/05     AdmissionWeight: 54.4 kg                 CurrentWeight: 53.3 kg  Referring physician Dr. Kurtis Bushman    CHIEF COMPLAINT:   Recurrent aspiration pneumonia   SUBJECTIVE   Patient is clinically improved. Ok for d/c from pulmonary perspective, will sign off , we can follow up with patient in clinic.   PAST MEDICAL HISTORY   Past Medical History:  Diagnosis Date  . Coronary artery disease   . Dysrhythmia   . Hypertension   . Pacemaker   . Stroke [facility])      SURGICAL HISTORY   Past Surgical History:  Procedure Laterality Date  . COLONOSCOPY WITH PROPOFOL N/A 12/11/2016   Procedure: COLONOSCOPY WITH PROPOFOL;  Surgeon: Jonathon Bellows, MD;  Location: [facility] ENDOSCOPY;  Service: Gastroenterology;  Laterality: N/A;  . ESOPHAGOGASTRODUODENOSCOPY N/A 12/09/2016   Procedure: ESOPHAGOGASTRODUODENOSCOPY (EGD);  Surgeon: Lin Landsman, MD;  Location: [facility] ENDOSCOPY;  Service: Gastroenterology;  Laterality: N/A;  . PACEMAKER INSERTION       FAMILY HISTORY   Family History  Problem Relation Age of Onset  . Breast cancer Mother 52  . Peripheral vascular disease Mother   . Diabetes Mother   . Stroke Father   . Diabetes Father   . Diabetes Sister   . Diabetes Brother      SOCIAL HISTORY   Social History   Tobacco Use  . Smoking status: Never Smoker  . Smokeless tobacco: Never Used  Substance Use Topics  . Alcohol use: No  . Drug use: No     MEDICATIONS    Home Medication:    Current Medication:  Current Facility-Administered Medications:  .  ALPRAZolam (XANAX) tablet 0.125 mg, 0.125 mg, Oral, q morning - 10a, Lenna Sciara, MD, 0.125 mg at 11/24/19 1327 .  ALPRAZolam Duanne Moron) tablet 0.25 mg, 0.25 mg, Oral, QHS, Lenna Sciara, MD, 0.25 mg at 11/24/19 2213 .  amLODipine (NORVASC) tablet 5 mg, 5 mg, Oral, Daily, Lenna Sciara, MD, 5 mg at 11/24/19 1327 .   Ampicillin-Sulbactam (UNASYN) 3 g in sodium chloride 0.9 % 100 mL IVPB, 3 g, Intravenous, Q12H, Shanlever, Pierce Crane, RPH, Last Rate: 200 mL/hr at 11/25/19 0105, 3 g at 11/25/19 0105 .  aspirin EC tablet 81 mg, 81 mg, Oral, Daily, Lenna Sciara, MD, 81 mg at 11/24/19 1326 .  docusate sodium (COLACE) capsule 100 mg, 100 mg, Oral, Daily, Lenna Sciara, MD, 100 mg at 11/24/19 1326 .  fluticasone (FLONASE) 50 MCG/ACT nasal spray 2 spray, 2 spray, Each Nare, Daily, Lenna Sciara, MD, 2 spray at 11/24/19 1332 .  heparin injection 5,000 Units, 5,000 Units, Subcutaneous, Q8H, Lenna Sciara, MD, 5,000 Units at 11/24/19 2213 .  hydrALAZINE (APRESOLINE) tablet 10 mg, 10 mg, Oral, Q8H PRN, Lenna Sciara, MD .  hydrochlorothiazide (MICROZIDE) capsule 12.5 mg, 12.5 mg, Oral, Daily, Lanney Gins, Jermika Olden, MD, 12.5 mg at 11/24/19 1326 .  latanoprost (XALATAN) 0.005 % ophthalmic solution 1 drop, 1 drop, Both Eyes, QHS, Lenna Sciara, MD, 1 drop at 11/24/19 2211 .  MEDLINE mouth rinse, 15 mL, Mouth Rinse, BID, Nolberto Hanlon, MD, 15 mL at 11/24/19 2213 .  pantoprazole (PROTONIX) EC tablet 40 mg, 40 mg, Oral, Daily, Kurtis Bushman, Sahar, MD, 40 mg at 11/24/19 1326 .  timolol (TIMOPTIC) 0.5 % ophthalmic solution 1 drop, 1 drop, Both  Eyes, Daily, Rayne Du, MD, 1 drop at 11/24/19 1327    ALLERGIES   Tramadol     REVIEW OF SYSTEMS    Review of Systems:  Gen:  Denies  fever, sweats, chills weigh loss  HEENT: Denies blurred vision, double vision, ear pain, eye pain, hearing loss, nose bleeds, sore throat Cardiac:  No dizziness, chest pain or heaviness, chest tightness,edema Resp:   Denies cough , denies sputum porduction, shortness of breath,wheezing, hemoptysis,  Gi: Denies swallowing difficulty, stomach pain, nausea or vomiting, diarrhea, constipation, bowel incontinence Gu:  Denies bladder incontinence, burning urine Ext:   Denies Joint pain, stiffness or swelling Skin: Denies  skin rash, easy bruising or bleeding  or hives Endoc:  Denies polyuria, polydipsia , polyphagia or weight change Psych:   Denies depression, insomnia or hallucinations   Other:  All other systems negative   VS: BP (!) 147/70 (BP Location: Left Arm)   Pulse 78   Temp 97.7 F (36.5 C) (Oral)   Resp 16   Ht 5\' 1"  (1.549 m)   Wt 53.3 kg   SpO2 94%   BMI 22.20 kg/m      PHYSICAL EXAM    GENERAL:NAD, no fevers, chills, no weakness no fatigue HEAD: Normocephalic, atraumatic.  EYES: Pupils equal, round, reactive to light. Extraocular muscles intact. No scleral icterus.  MOUTH: Moist mucosal membrane. Dentition intact. No abscess noted.  EAR, NOSE, THROAT: Clear without exudates. No external lesions.  NECK: Supple. No thyromegaly. No nodules. No JVD.  PULMONARY: CTAB CARDIOVASCULAR: S1 and S2. Regular rate and rhythm. No murmurs, rubs, or gallops. No edema. Pedal pulses 2+ bilaterally.  GASTROINTESTINAL: Soft, nontender, nondistended. No masses. Positive bowel sounds. No hepatosplenomegaly.  MUSCULOSKELETAL: No swelling, clubbing, or edema. Range of motion full in all extremities.  NEUROLOGIC: Cranial nerves II through XII are intact. No gross focal neurological deficits. Sensation intact. Reflexes intact.  SKIN: No ulceration, lesions, rashes, or cyanosis. Skin warm and dry. Turgor intact.  PSYCHIATRIC: Mood, affect within normal limits. The patient is awake, alert and oriented x 3. Insight, judgment intact.       IMAGING    CT ANGIO CHEST PE W OR WO CONTRAST  Result Date: 11/21/2019 CLINICAL DATA:  Hypoxia, elevated D-dimer EXAM: CT ANGIOGRAPHY CHEST WITH CONTRAST TECHNIQUE: Multidetector CT imaging of the chest was performed using the standard protocol during bolus administration of intravenous contrast. Multiplanar CT image reconstructions and MIPs were obtained to evaluate the vascular anatomy. CONTRAST:  49mL OMNIPAQUE IOHEXOL 350 MG/ML SOLN COMPARISON:  Same-day chest radiographs. FINDINGS: Cardiovascular:  Satisfactory opacification of the pulmonary arteries to the segmental level. No evidence of pulmonary embolism. Cardiomegaly. Three-vessel coronary artery calcifications. No pericardial effusion. Aortic atherosclerosis. Mediastinum/Nodes: No enlarged mediastinal, hilar, or axillary lymph nodes. Fluid in the trachea. Patulous, partially fluid-filled esophagus. Lungs/Pleura: Scattered ground-glass airspace opacities, most conspicuous in the dependent bilateral lower lobes. No pleural effusion or pneumothorax. Upper Abdomen: No acute abnormality. Large gallstone in the partially imaged gallbladder. Musculoskeletal: No chest wall abnormality. No acute or significant osseous findings. Review of the MIP images confirms the above findings. IMPRESSION: 1. Negative examination for pulmonary embolism. 2. Scattered ground-glass airspace opacities, most conspicuous in the dependent bilateral lower lobes, nonspecific and infectious or inflammatory. 3. Fluid in the trachea, concerning for aspiration or accumulated secretions. 4. Patulous, partially fluid-filled esophagus, which may place the patient at risk for aspiration. 5. Cardiomegaly.  Coronary artery disease. 6. Aortic Atherosclerosis (ICD10-I70.0). Electronically Signed   By: 72m  M.D.   On: 11/21/2019 12:42   DG Chest Port 1 View  Result Date: 11/23/2019 CLINICAL DATA:  Pulmonary disease. EXAM: PORTABLE CHEST 1 VIEW COMPARISON:  11/20/2019 FINDINGS: The cardiac silhouette, mediastinal and hilar contours are within normal limits and stable. Mild tortuosity and calcification of the thoracic aorta. Right atrial and right ventricular pacer wires in good position, unchanged. No acute pulmonary findings. No infiltrates, edema or effusions. No pulmonary lesions. IMPRESSION: No acute cardiopulmonary findings. Electronically Signed   By: Rudie Meyer M.D.   On: 11/23/2019 15:02   DG Chest Port 1 View  Result Date: 11/20/2019 CLINICAL DATA:  Shortness of breath and  cough. EXAM: PORTABLE CHEST 1 VIEW COMPARISON:  04/09/2013 FINDINGS: The heart size and pulmonary vascularity are normal considering the AP portable technique. Pacemaker in place. Aortic atherosclerosis. The lungs are clear. No effusions. No pneumothorax. No acute bone abnormality. IMPRESSION: No acute abnormality. Aortic Atherosclerosis (ICD10-I70.0). Electronically Signed   By: Francene Boyers M.D.   On: 11/20/2019 13:40      ASSESSMENT/PLAN   Recurrent aspiration pneumonia -Status post swallow evaluation with findings consistent with aspiration risk. - mechanical soft diet with reflux precautions and breaks as per speech pathology -will switch antibiotics to Unasyn as mostly likely etiology is aspiration pneumonia at this time  -RVP in process - ground glass infiltrate - possible viral LRTI -Unasyn 1.5g q6h- switch to augmentin today -pharmacy consultation for pharmacodynamics Respiratory cultures in process -pt with watery stools - negative legionella ab-stool softners stopped  -hypertonic saline to induce cough for bronchopulmonary hygiene -elevated head of bed to 30 degrees -clinically improved - cleared from pulmonary for d/c home with 7d follow up with pulmonary on outpatient basis.     Possible Melena   - patient was on stool softners, this was d/cd   - fecal occult card is negative   Thank you for allowing me to participate in the care of this patient.    Patient/Family are satisfied with care plan and all questions have been answered.   This document was prepared using Dragon voice recognition software and may include unintentional dictation errors.     Vida Rigger, M.D.  Division of Pulmonary & Critical Care Medicine  [facility]

## 2019-11-25 NOTE — Consult Note (Signed)
Pharmacy Antibiotic Note  Brooke Potts is a 86 y.o. female admitted on 11/20/2019 with aspiration pneumonia.  Pharmacy has been consulted for Unasyn dosing.  Plan: Day 6 IV abx (out of proposed 7)  Continue Unasyn 3g q12H   Height: 5\' 1"  (154.9 cm) Weight: 117 lb 8.1 oz (53.3 kg) IBW/kg (Calculated) : 47.8  Temp (24hrs), Avg:97.7 F (36.5 C), Min:97.7 F (36.5 C), Max:97.8 F (36.6 C)  Recent Labs  Lab 11/20/19 1359 11/21/19 0605 11/22/19 0624 11/23/19 0229 11/23/19 0900 11/24/19 0829  WBC 22.3* 29.1* 16.6*  --  11.5* 9.0  CREATININE 0.84 1.02* 1.16* 1.15*  --  0.91    Estimated Creatinine Clearance: 28.5 mL/min (by C-G formula based on SCr of 0.91 mg/dL).    Allergies  Allergen Reactions  . Tramadol Nausea And Vomiting and Nausea Only    Antimicrobials this admission: 02/05 CTX & Azith x 1 02/6 Zosyn >>02/7 02/7 Unasyn >>  Dose adjustments this admission: none  Microbiology results: UCx pending Resp Panel/Sputum Cx pending COVID/FLU NEG  Thank you for allowing pharmacy to be a part of this patient's care.  04/7, PharmD, BCPS Clinical Pharmacist 11/25/2019 9:47 AM

## 2021-01-09 IMAGING — DX DG CHEST 1V PORT
1 series · 1 of 1 positions shown · non-contrast
Comparison: 11/20/2019

CLINICAL DATA: Pulmonary disease.

EXAM:
PORTABLE CHEST 1 VIEW

[chest ap]
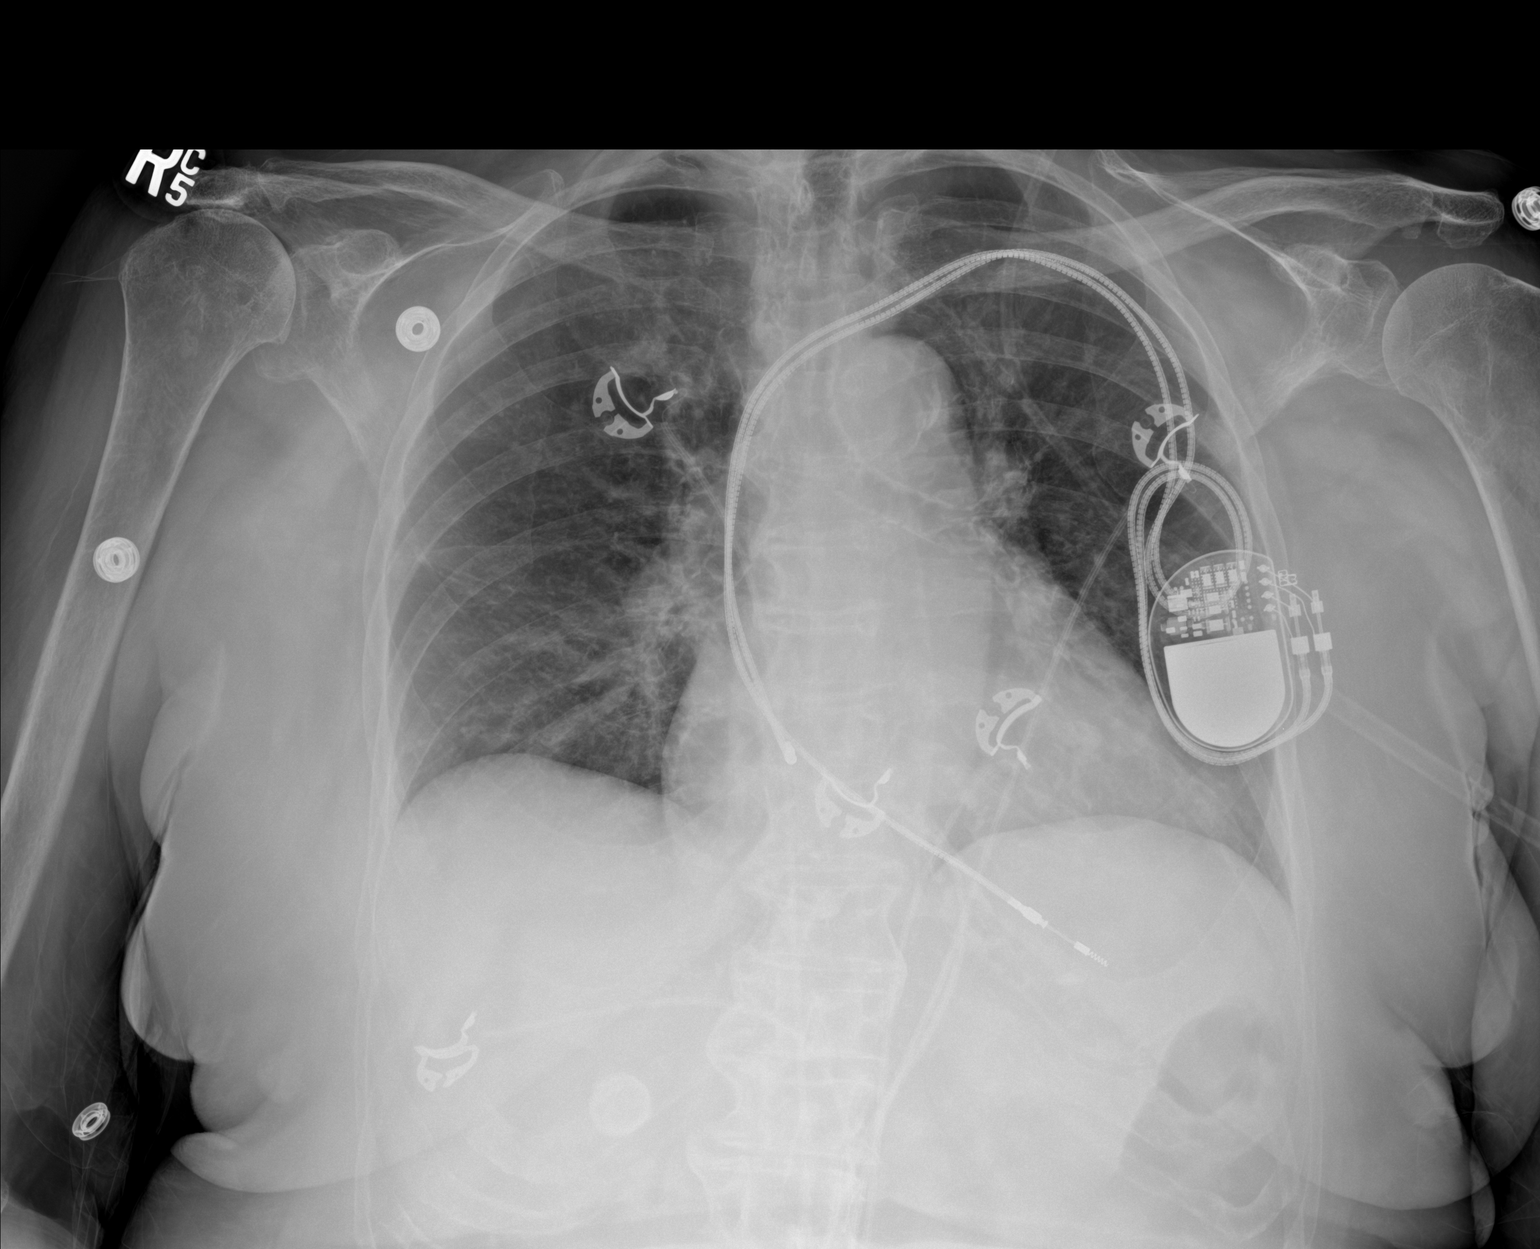

[1 of 1 positions shown; findings below may reference images not displayed]

FINDINGS: The cardiac silhouette, mediastinal and hilar contours are within
normal limits and stable.

Mild tortuosity and calcification of the thoracic aorta.

Right atrial and right ventricular pacer wires in good position,
unchanged.

No acute pulmonary findings. No infiltrates, edema or effusions. No
pulmonary lesions.
IMPRESSION: No acute cardiopulmonary findings.

## 2022-09-16 ENCOUNTER — Other Ambulatory Visit: Payer: Medicare Other

## 2022-09-16 ENCOUNTER — Emergency Department: Payer: Medicare Other

## 2022-09-16 ENCOUNTER — Other Ambulatory Visit: Payer: Self-pay

## 2022-09-16 ENCOUNTER — Encounter: Payer: Self-pay | Admitting: Internal Medicine

## 2022-09-16 ENCOUNTER — Inpatient Hospital Stay
Admission: EM | Admit: 2022-09-16 | Discharge: 2022-09-17 | DRG: 064 | Disposition: A | Discharge status: Hospice | Payer: Medicare Other | Attending: Internal Medicine | Admitting: Internal Medicine

## 2022-09-16 ENCOUNTER — Other Ambulatory Visit: Payer: Self-pay | Admitting: Radiology

## 2022-09-16 DIAGNOSIS — Z79899 Other long term (current) drug therapy: Secondary | ICD-10-CM

## 2022-09-16 DIAGNOSIS — W19XXXA Unspecified fall, initial encounter: Secondary | ICD-10-CM

## 2022-09-16 DIAGNOSIS — R29722 NIHSS score 22: Secondary | ICD-10-CM | POA: Diagnosis present

## 2022-09-16 DIAGNOSIS — I10 Essential (primary) hypertension: Secondary | ICD-10-CM | POA: Diagnosis present

## 2022-09-16 DIAGNOSIS — I639 Cerebral infarction, unspecified: Secondary | ICD-10-CM | POA: Diagnosis present

## 2022-09-16 DIAGNOSIS — I4891 Unspecified atrial fibrillation: Secondary | ICD-10-CM | POA: Diagnosis not present

## 2022-09-16 DIAGNOSIS — G9341 Metabolic encephalopathy: Secondary | ICD-10-CM | POA: Diagnosis present

## 2022-09-16 DIAGNOSIS — Z8249 Family history of ischemic heart disease and other diseases of the circulatory system: Secondary | ICD-10-CM

## 2022-09-16 DIAGNOSIS — Z8673 Personal history of transient ischemic attack (TIA), and cerebral infarction without residual deficits: Secondary | ICD-10-CM | POA: Diagnosis not present

## 2022-09-16 DIAGNOSIS — I63412 Cerebral infarction due to embolism of left middle cerebral artery: Principal | ICD-10-CM | POA: Diagnosis present

## 2022-09-16 DIAGNOSIS — R4182 Altered mental status, unspecified: Secondary | ICD-10-CM | POA: Diagnosis present

## 2022-09-16 DIAGNOSIS — Z823 Family history of stroke: Secondary | ICD-10-CM | POA: Diagnosis not present

## 2022-09-16 DIAGNOSIS — Z950 Presence of cardiac pacemaker: Secondary | ICD-10-CM

## 2022-09-16 DIAGNOSIS — S51811A Laceration without foreign body of right forearm, initial encounter: Secondary | ICD-10-CM

## 2022-09-16 DIAGNOSIS — I2510 Atherosclerotic heart disease of native coronary artery without angina pectoris: Secondary | ICD-10-CM | POA: Diagnosis present

## 2022-09-16 DIAGNOSIS — I495 Sick sinus syndrome: Secondary | ICD-10-CM | POA: Diagnosis present

## 2022-09-16 DIAGNOSIS — Z1152 Encounter for screening for COVID-19: Secondary | ICD-10-CM

## 2022-09-16 DIAGNOSIS — Z515 Encounter for palliative care: Secondary | ICD-10-CM

## 2022-09-16 DIAGNOSIS — Z803 Family history of malignant neoplasm of breast: Secondary | ICD-10-CM

## 2022-09-16 DIAGNOSIS — R29810 Facial weakness: Secondary | ICD-10-CM | POA: Diagnosis present

## 2022-09-16 DIAGNOSIS — G9340 Encephalopathy, unspecified: Principal | ICD-10-CM

## 2022-09-16 DIAGNOSIS — W01198A Fall on same level from slipping, tripping and stumbling with subsequent striking against other object, initial encounter: Secondary | ICD-10-CM | POA: Diagnosis present

## 2022-09-16 DIAGNOSIS — Z833 Family history of diabetes mellitus: Secondary | ICD-10-CM | POA: Diagnosis not present

## 2022-09-16 DIAGNOSIS — I63512 Cerebral infarction due to unspecified occlusion or stenosis of left middle cerebral artery: Secondary | ICD-10-CM

## 2022-09-16 DIAGNOSIS — I480 Paroxysmal atrial fibrillation: Secondary | ICD-10-CM | POA: Diagnosis present

## 2022-09-16 DIAGNOSIS — Y92012 Bathroom of single-family (private) house as the place of occurrence of the external cause: Secondary | ICD-10-CM | POA: Diagnosis not present

## 2022-09-16 DIAGNOSIS — Z7982 Long term (current) use of aspirin: Secondary | ICD-10-CM

## 2022-09-16 DIAGNOSIS — Z66 Do not resuscitate: Secondary | ICD-10-CM | POA: Diagnosis present

## 2022-09-16 DIAGNOSIS — F419 Anxiety disorder, unspecified: Secondary | ICD-10-CM | POA: Diagnosis present

## 2022-09-16 DIAGNOSIS — S51011A Laceration without foreign body of right elbow, initial encounter: Secondary | ICD-10-CM | POA: Diagnosis present

## 2022-09-16 DIAGNOSIS — Z885 Allergy status to narcotic agent status: Secondary | ICD-10-CM | POA: Diagnosis not present

## 2022-09-16 DIAGNOSIS — G8191 Hemiplegia, unspecified affecting right dominant side: Secondary | ICD-10-CM | POA: Diagnosis present

## 2022-09-16 DIAGNOSIS — F0394 Unspecified dementia, unspecified severity, with anxiety: Secondary | ICD-10-CM | POA: Diagnosis present

## 2022-09-16 LAB — URINE DRUG SCREEN, QUALITATIVE (ARMC ONLY)
Amphetamines, Ur Screen: NOT DETECTED
Barbiturates, Ur Screen: NOT DETECTED
Benzodiazepine, Ur Scrn: POSITIVE — AB
Cannabinoid 50 Ng, Ur ~~LOC~~: NOT DETECTED
Cocaine Metabolite,Ur ~~LOC~~: NOT DETECTED
MDMA (Ecstasy)Ur Screen: NOT DETECTED
Methadone Scn, Ur: NOT DETECTED
Opiate, Ur Screen: NOT DETECTED
Phencyclidine (PCP) Ur S: NOT DETECTED
Tricyclic, Ur Screen: NOT DETECTED

## 2022-09-16 LAB — CBC WITH DIFFERENTIAL/PLATELET
Abs Immature Granulocytes: 0.04 10*3/uL (ref 0.00–0.07)
Basophils Absolute: 0.1 10*3/uL (ref 0.0–0.1)
Basophils Relative: 1 %
Eosinophils Absolute: 0.1 10*3/uL (ref 0.0–0.5)
Eosinophils Relative: 2 %
HCT: 43.9 % (ref 36.0–46.0)
Hemoglobin: 14.7 g/dL (ref 12.0–15.0)
Immature Granulocytes: 1 %
Lymphocytes Relative: 38 %
Lymphs Abs: 2.9 10*3/uL (ref 0.7–4.0)
MCH: 31.2 pg (ref 26.0–34.0)
MCHC: 33.5 g/dL (ref 30.0–36.0)
MCV: 93.2 fL (ref 80.0–100.0)
Monocytes Absolute: 0.5 10*3/uL (ref 0.1–1.0)
Monocytes Relative: 7 %
Neutro Abs: 4 10*3/uL (ref 1.7–7.7)
Neutrophils Relative %: 51 %
Platelets: 256 10*3/uL (ref 150–400)
RBC: 4.71 MIL/uL (ref 3.87–5.11)
RDW: 13.2 % (ref 11.5–15.5)
WBC: 7.6 10*3/uL (ref 4.0–10.5)
nRBC: 0 % (ref 0.0–0.2)

## 2022-09-16 LAB — COMPREHENSIVE METABOLIC PANEL
ALT: 13 U/L (ref 0–44)
AST: 23 U/L (ref 15–41)
Albumin: 3.4 g/dL — ABNORMAL LOW (ref 3.5–5.0)
Alkaline Phosphatase: 100 U/L (ref 38–126)
Anion gap: 5 (ref 5–15)
BUN: 21 mg/dL (ref 8–23)
CO2: 23 mmol/L (ref 22–32)
Calcium: 9.2 mg/dL (ref 8.9–10.3)
Chloride: 107 mmol/L (ref 98–111)
Creatinine, Ser: 1 mg/dL (ref 0.44–1.00)
GFR, Estimated: 51 mL/min — ABNORMAL LOW (ref >60)
Glucose, Bld: 108 mg/dL — ABNORMAL HIGH (ref 70–99)
Potassium: 3.6 mmol/L (ref 3.5–5.1)
Sodium: 135 mmol/L (ref 135–145)
Total Bilirubin: 0.7 mg/dL (ref 0.3–1.2)
Total Protein: 7.6 g/dL (ref 6.5–8.1)

## 2022-09-16 LAB — PROTIME-INR
INR: 1.1 (ref 0.8–1.2)
Prothrombin Time: 13.9 seconds (ref 11.4–15.2)

## 2022-09-16 LAB — URINALYSIS, ROUTINE W REFLEX MICROSCOPIC
Bilirubin Urine: NEGATIVE
Glucose, UA: NEGATIVE mg/dL
Hgb urine dipstick: NEGATIVE
Ketones, ur: NEGATIVE mg/dL
Leukocytes,Ua: NEGATIVE
Nitrite: NEGATIVE
Protein, ur: NEGATIVE mg/dL
Specific Gravity, Urine: 1.011 (ref 1.005–1.030)
pH: 5 (ref 5.0–8.0)

## 2022-09-16 LAB — TROPONIN I (HIGH SENSITIVITY): Troponin I (High Sensitivity): 8 ng/L (ref <18)

## 2022-09-16 LAB — GLUCOSE, CAPILLARY: Glucose-Capillary: 180 mg/dL — ABNORMAL HIGH (ref 70–99)

## 2022-09-16 LAB — BLOOD GAS, VENOUS
Acid-base deficit: 0.5 mmol/L (ref 0.0–2.0)
Bicarbonate: 24.2 mmol/L (ref 20.0–28.0)
O2 Saturation: 86.8 %
Patient temperature: 37
pCO2, Ven: 39 mmHg — ABNORMAL LOW (ref 44–60)
pH, Ven: 7.4 (ref 7.25–7.43)
pO2, Ven: 56 mmHg — ABNORMAL HIGH (ref 32–45)

## 2022-09-16 LAB — SARS CORONAVIRUS 2 BY RT PCR: SARS Coronavirus 2 by RT PCR: NEGATIVE

## 2022-09-16 MED ORDER — DOCUSATE SODIUM 100 MG PO CAPS: 100.0000 mg | ORAL_CAPSULE | Freq: Every day | ORAL | Status: DC | PRN

## 2022-09-16 MED ORDER — HYDRALAZINE HCL 20 MG/ML IJ SOLN: 5.0000 mg | INTRAMUSCULAR | Status: DC | PRN

## 2022-09-16 MED ORDER — MORPHINE SULFATE (PF) 2 MG/ML IV SOLN: 1.0000 mg | INTRAVENOUS | Status: DC | PRN

## 2022-09-16 MED ORDER — AMLODIPINE BESYLATE 5 MG PO TABS: 5.0000 mg | ORAL_TABLET | Freq: Every day | ORAL | Status: DC

## 2022-09-16 MED ORDER — ALPRAZOLAM 0.25 MG PO TABS: 0.1250 mg | ORAL_TABLET | Freq: Every day | ORAL | Status: DC

## 2022-09-16 MED ORDER — HYDRALAZINE HCL 20 MG/ML IJ SOLN
5.0000 mg | INTRAMUSCULAR | Status: DC | PRN
  Administered 2022-09-16: 5 mg via INTRAVENOUS
  Filled 2022-09-16: qty 1

## 2022-09-16 MED ORDER — ACETAMINOPHEN 325 MG PO TABS: 650.0000 mg | ORAL_TABLET | Freq: Four times a day (QID) | ORAL | Status: DC | PRN

## 2022-09-16 MED ORDER — ALPRAZOLAM 0.25 MG PO TABS: 0.2500 mg | ORAL_TABLET | Freq: Every day | ORAL | Status: DC

## 2022-09-16 MED ORDER — ATORVASTATIN CALCIUM 20 MG PO TABS: 40.0000 mg | ORAL_TABLET | Freq: Every day | ORAL | Status: DC

## 2022-09-16 MED ORDER — ENOXAPARIN SODIUM 40 MG/0.4ML IJ SOSY
40.0000 mg | PREFILLED_SYRINGE | INTRAMUSCULAR | Status: DC
  Administered 2022-09-16: 40 mg via SUBCUTANEOUS
  Filled 2022-09-16: qty 0.4

## 2022-09-16 MED ORDER — ONDANSETRON HCL 4 MG/2ML IJ SOLN: 4.0000 mg | Freq: Three times a day (TID) | INTRAMUSCULAR | Status: DC | PRN

## 2022-09-16 MED ORDER — LISINOPRIL 20 MG PO TABS: 20.0000 mg | ORAL_TABLET | Freq: Every day | ORAL | Status: DC

## 2022-09-16 MED ORDER — ALPRAZOLAM 0.25 MG PO TABS: 0.1250 mg | ORAL_TABLET | ORAL | Status: DC

## 2022-09-16 MED ORDER — LORAZEPAM 2 MG/ML IJ SOLN: 0.5000 mg | INTRAMUSCULAR | Status: DC | PRN

## 2022-09-16 MED ORDER — LATANOPROST 0.005 % OP SOLN
1.0000 [drp] | Freq: Every day | OPHTHALMIC | Status: DC
  Filled 2022-09-16 (×2): qty 2.5

## 2022-09-16 MED ORDER — ASPIRIN 81 MG PO TBEC: 81.0000 mg | DELAYED_RELEASE_TABLET | Freq: Every day | ORAL | Status: DC

## 2022-09-16 MED ORDER — TIMOLOL MALEATE 0.5 % OP SOLN
1.0000 [drp] | Freq: Every day | OPHTHALMIC | Status: DC
  Administered 2022-09-17: 1 [drp] via OPHTHALMIC
  Filled 2022-09-16: qty 5

## 2022-09-16 MED ORDER — STROKE: EARLY STAGES OF RECOVERY BOOK: Freq: Once | Status: AC

## 2022-09-16 NOTE — ED Triage Notes (Signed)
Pt in via EMS from home with c/o fall. Pt has dementia and had unwitnessed fall in the bathroom. No LOC, no head injury. Pt with skin tears to right forearm. No blood thinners but takes 81mg  asa. Pt family reports pt usually talks but has not talked this am. Pt with previous L2 fx. 98HR, 95% RA, 147/67, cbg 1110.

## 2022-09-16 NOTE — Consult Note (Signed)
Neurology Consultation  Reason for Consult: Stroke Referring Physician: Dr. Clyde Lundborg  CC: Fall, right-sided weakness  History is obtained from: Chart review, phone with daughter Aurea Graff  HPI: Brooke Potts is a 88 y.o. female past history of paroxysmal atrial fibrillation not on anticoagulation due to prior GI bleed on Coumadin, hypertension, pacemaker placement, dementia within modified Rankin of 4-5, presenting after family heard her fell in the bathroom this morning.  Last known well was sometime last night when she went to bed around 730 or 8 PM.  She likely woke up this morning and tried to walk to the bathroom and family heard a fall.  She was not moving her right side very well.  She was brought into the ER.  At the time of initial ER evaluation, she was somewhat weaker on the right side but did not have any gaze preference or deviation.  I was called for consult for possible stroke because she also had right-sided facial weakness and was having difficulty talking. On my evaluation in the room, she had a frank forced leftward gaze, right sided hemianopsia, right lower facial weakness, right upper extremity increased tone and weakness and right lower extremity weakness as evidenced by weaker withdrawal to noxious stimulation in comparison to the left. Spoke with the daughter Aurea Graff on the phone.  Corroborated the history.  She also said the patient did not want any heroic or aggressive measures, and the daughter and her other sister who lives with the patient are not able to take care of her at home even without the stroke and with the stroke it would be very difficult.  They would only want her to be more comfortable and pursue comfort measures   LKW: 7:30 PM 09/15/2022 IV thrombolysis given?: no, side the window EVT: Not activated as a LVO code stroke but poor baseline to even considered for it with a NRS of 4-5 at baseline Premorbid modified Rankin scale (mRS): 4-5   ROS: Full ROS was performed  and is negative except as noted in the HPI.    Past Medical History:  Diagnosis Date   Coronary artery disease    Dysrhythmia    Hypertension    Pacemaker    Stroke [facility])      Family History  Problem Relation Age of Onset   Breast cancer Mother 10   Peripheral vascular disease Mother    Diabetes Mother    Stroke Father    Diabetes Father    Diabetes Sister    Diabetes Brother      Social History:   reports that she has never smoked. She has never used smokeless tobacco. She reports that she does not drink alcohol and does not use drugs.  Medications  Current Facility-Administered Medications:    [START ON 09/17/2022]  stroke: early stages of recovery book, , Does not apply, Once, Lorretta Harp, MD   acetaminophen (TYLENOL) tablet 650 mg, 650 mg, Oral, Q6H PRN, Lorretta Harp, MD   ALPRAZolam Prudy Feeler) tablet 0.125 mg, 0.125 mg, Oral, Daily, Manfred Shirts, RPH   ALPRAZolam Prudy Feeler) tablet 0.25 mg, 0.25 mg, Oral, QHS, Manfred Shirts, RPH   aspirin EC tablet 81 mg, 81 mg, Oral, Daily, Lorretta Harp, MD   atorvastatin (LIPITOR) tablet 40 mg, 40 mg, Oral, Daily, Lorretta Harp, MD   docusate sodium (COLACE) capsule 100 mg, 100 mg, Oral, Daily PRN, Lorretta Harp, MD   enoxaparin (LOVENOX) injection 40 mg, 40 mg, Subcutaneous, Q24H, Lorretta Harp, MD   hydrALAZINE (  APRESOLINE) injection 5 mg, 5 mg, Intravenous, Q2H PRN, Lorretta Harp, MD   latanoprost (XALATAN) 0.005 % ophthalmic solution 1 drop, 1 drop, Both Eyes, QHS, Lorretta Harp, MD   ondansetron (ZOFRAN) injection 4 mg, 4 mg, Intravenous, Q8H PRN, Lorretta Harp, MD   timolol (TIMOPTIC) 0.5 % ophthalmic solution 1 drop, 1 drop, Both Eyes, Daily, Lorretta Harp, MD   Exam: Current vital signs: BP (!) 153/64 (BP Location: Left Leg)   Pulse 79   Temp 97.8 F (36.6 C)   Resp 18   Ht 5\' 1"  (1.549 m)   Wt 53 kg   SpO2 95%   BMI 22.08 kg/m  Vital signs in last 24 hours: Temp:  [96.4 F (35.8 C)-97.8 F (36.6 C)] 97.8 F (36.6 C) (12/03  1517) Pulse Rate:  [74-90] 79 (12/03 1517) Resp:  [16-20] 18 (12/03 1517) BP: (153-174)/(64-99) 153/64 (12/03 1517) SpO2:  [93 %-97 %] 95 % (12/03 1517) Weight:  [53 kg] 53 kg (12/03 0715) General: Awake alert in no distress HEENT: Normocephalic atraumatic Lungs: Clear Cardiovascular: Irregularly irregular Abdomen nondistended nontender Extremities with tear in the right elbow Neurological exam She is awake, appears alert. She is globally aphasic Cranial nerves: Pupils are equal round reactive to light, left gaze forced deviation, no blink to threat from the right side, blinks to threat from the left side, right lower facial weakness evident at rest. Motor examination with minimal withdrawal to noxious stimulation on the right upper extremity which is already flexed and kept tightly close to her body.  Does not open her fist at this time.  Right lower extremity Dr. Stimulation has weak withdrawal.  Left upper and lower extremity have spontaneous movement and strong withdrawal to noxious stimulation. Sensation: Appears diminished on the right as above Coordination difficult to assess given her mentation NIHSS 1a Level of Conscious.: 0 1b LOC Questions: 2 1c LOC Commands: 2 2 Best Gaze: 2 3 Visual: 2 4 Facial Palsy: 2 5a Motor Arm - left: 0 5b Motor Arm - Right: 3 6a Motor Leg - Left: 0 6b Motor Leg - Right: 2 7 Limb Ataxia: 0 8 Sensory: 2 9 Best Language: 3 10 Dysarthria: 2 11 Extinct. and Inatten.: 0 TOTAL: 22   Labs I have reviewed labs in epic and the results pertinent to this consultation are:  CBC    Component Value Date/Time   WBC 7.6 09/16/2022 0723   RBC 4.71 09/16/2022 0723   HGB 14.7 09/16/2022 0723   HGB 13.6 04/09/2013 1125   HCT 43.9 09/16/2022 0723   HCT 25.7 (L) 12/09/2016 1051   PLT 256 09/16/2022 0723   PLT 259 04/09/2013 1125   MCV 93.2 09/16/2022 0723   MCV 93 04/09/2013 1125   MCH 31.2 09/16/2022 0723   MCHC 33.5 09/16/2022 0723   RDW 13.2  09/16/2022 0723   RDW 12.7 04/09/2013 1125   LYMPHSABS 2.9 09/16/2022 0723   LYMPHSABS 2.2 04/09/2013 1125   MONOABS 0.5 09/16/2022 0723   MONOABS 0.5 04/09/2013 1125   EOSABS 0.1 09/16/2022 0723   EOSABS 0.1 04/09/2013 1125   BASOSABS 0.1 09/16/2022 0723   BASOSABS 0.1 04/09/2013 1125    CMP     Component Value Date/Time   NA 135 09/16/2022 0723   NA 132 (L) 04/09/2013 1125   K 3.6 09/16/2022 0723   K 3.7 04/09/2013 1125   CL 107 09/16/2022 0723   CL 96 (L) 04/09/2013 1125   CO2 23 09/16/2022 0723   CO2 30  04/09/2013 1125   GLUCOSE 108 (H) 09/16/2022 0723   GLUCOSE 98 04/09/2013 1125   BUN 21 09/16/2022 0723   BUN 11 04/09/2013 1125   CREATININE 1.00 09/16/2022 0723   CREATININE 0.97 04/09/2013 1125   CALCIUM 9.2 09/16/2022 0723   CALCIUM 9.3 04/09/2013 1125   PROT 7.6 09/16/2022 0723   ALBUMIN 3.4 (L) 09/16/2022 0723   AST 23 09/16/2022 0723   ALT 13 09/16/2022 0723   ALKPHOS 100 09/16/2022 0723   BILITOT 0.7 09/16/2022 0723   GFRNONAA 51 (L) 09/16/2022 0723   GFRNONAA 53 (L) 04/09/2013 1125   GFRAA >60 11/24/2019 0829   GFRAA >60 04/09/2013 1125    Imaging I have reviewed the images obtained:  CT head unremarkable for acute process.    Assessment:  88 year old woman with atrial fibrillation not on anticoagulations among other comorbidities presenting to the emergency room after having a fall this morning.  Last known well noticed and witnessed by family was prior to going to bed last night.  In the ER initially had some subtle right-sided weakness but when I was called for consultation on the floor, where she was admitted because of not talking much and subtle right-sided weakness, now exhibits signs of a large left MCA territory infarction. I suspect a cardioembolic stroke. Outside the window for IV thrombolysis.  Poor baseline precludes thrombectomy. Spoke with the daughter Aurea Graff over the phone-does not want heroic measures and would not even want CTA echo  etc. They are likely going to transition to comfort measures.  Recommendations: Family likely to transition to comfort measures. Spoke with the daughter-does not want any risk factor workup for stroke done because it is not good to change the outcome. At this point, medical management per the medicine team.  I would recommend reaching back out to family and confirming the desire for comfort measures either by the medicine team or by the palliative care team. Inpatient neurology service will be available as needed. Please call with questions. I relayed my plan to Dr. Clyde Lundborg, and also discussed the initial exam with the ED provider-seems like she progressed during her stay in the hospital to a full-blown picture of an LVO stroke.  -- Milon Dikes, MD Neurologist [facility] Pager: [phone]

## 2022-09-16 NOTE — Progress Notes (Signed)
Patient resting in bed. Family at bedside. No concerns at this moment. Patient repositioned. Pure wick in place. Bed lowest position, alarm on, call bell in reach and family aware.

## 2022-09-16 NOTE — ED Triage Notes (Signed)
Pt presents after an unwitnessed fall in her bathroom this am. Pt with hx of dementia. Pt not communicating with RN, no family present. Pt with blood noted to right arm from abrasion.

## 2022-09-16 NOTE — Progress Notes (Signed)
Patient arrived to unit from ED, right facial droop, right arm contracture, limited right leg movement, difficulty swallowing and unable to follow commands. Forced gaze to the left. Dr Clyde Lundborg immediately contacted and states he will get neurology. Gavin Pound (daughter) called to update. She states patient at baseline walks and talks, does not have any facial droop at baseline. Neurology came to bedside, placed orders and assessed patient thoroughly. Suction set up at bedside. Floor mat in place, bed lowest position.

## 2022-09-16 NOTE — Plan of Care (Signed)
  Problem: Education: Goal: Knowledge of General Education information will improve Description Including pain rating scale, medication(s)/side effects and non-pharmacologic comfort measures Outcome: Progressing   Problem: Health Behavior/Discharge Planning: Goal: Ability to manage health-related needs will improve Outcome: Progressing   

## 2022-09-16 NOTE — ED Notes (Signed)
Pt taken for scans 

## 2022-09-16 NOTE — H&P (Signed)
History and Physical    Brooke Potts ZOX:[id number] DOB: 01/02/1923 DOA: 09/16/2022  Referring MD/NP/PA:   PCP: Gracelyn Nurse, MD   Patient coming from:  The patient is coming from home.  At  Chief Complaint: fall and altered mental status.  HPI: Brooke Potts is a 88 y.o. female with medical history significant of dementia, hypertension, stroke, GERD, anxiety, pacemaker placement due to SSS,, CAD, atrial fibrillation not on anticoagulants, GI bleeding, vertigo, who presents with fall and altered mental status.  Per her daughter at the bedside, patient fell this morning, hit her head and right arm.  Patient has skin tear in right elbow area.  Daughter states that at normal baseline, patient recognizes her, knows the place, interacting and talking to her, usually not orientated to the time.  Today patient is more confused.  She is not orientated to the place.  She is not talking and not interactive today. Patient does not seem to have chest pain, cough, shortness breath, no nausea vomiting, diarrhea or abdominal pain, no symptoms of UTI per her daughter.  Per EDP, initially pt withdrew to stimuli bl UE and LE this morning, no eye deviation though she might have had a mild L gaze preference. Later on, pt was noted to have right side sided weakness, facial droop and frank forced leftward gaze.   Data reviewed independently and ED Course: pt was found to have WBC 7.6, troponin level 8, negative COVID PCR, negative urinalysis, creatinine 1.0, BUN 21, temperature 97.5, blood pressure 160/67, heart rate 74, RR 16, oxygen saturation 94% on room air.  Images are negative for acute injury including CT of head, CT of C-spine, chest x-ray, x-ray of pelvis, right humerus and right forearm.  Patient is placed on telemetry bed for observation. Consulted Dr. Wilford Corner of neuro.   EKG: I have personally reviewed.  Paced rhythm, QTc 557.  Review of Systems: Could not be reviewed due to dementia and  altered mental status.   Allergy:  Allergies  Allergen Reactions   Tramadol Nausea And Vomiting and Nausea Only    Past Medical History:  Diagnosis Date   Coronary artery disease    Dysrhythmia    Hypertension    Pacemaker    Stroke [facility])     Past Surgical History:  Procedure Laterality Date   COLONOSCOPY WITH PROPOFOL N/A 12/11/2016   Procedure: COLONOSCOPY WITH PROPOFOL;  Surgeon: Wyline Mood, MD;  Location: [facility] ENDOSCOPY;  Service: Gastroenterology;  Laterality: N/A;   ESOPHAGOGASTRODUODENOSCOPY N/A 12/09/2016   Procedure: ESOPHAGOGASTRODUODENOSCOPY (EGD);  Surgeon: Toney Reil, MD;  Location: [facility] ENDOSCOPY;  Service: Gastroenterology;  Laterality: N/A;   PACEMAKER INSERTION      Social History:  reports that she has never smoked. She has never used smokeless tobacco. She reports that she does not drink alcohol and does not use drugs.  Family History:  Family History  Problem Relation Age of Onset   Breast cancer Mother 63   Peripheral vascular disease Mother    Diabetes Mother    Stroke Father    Diabetes Father    Diabetes Sister    Diabetes Brother      Prior to Admission medications   Medication Sig Start Date End Date Taking? Authorizing Provider  ALPRAZolam (XANAX) 0.25 MG tablet Take 0.125-0.25 mg by mouth See admin instructions. Take  tablet (0.125mg ) by mouth every morning and take 1 tablet (0.25mg ) by mouth at bedtime    [provider]  amLODipine (NORVASC) 5  MG tablet Take 5 mg by mouth daily. 11/17/19   [provider]  aspirin EC 81 MG tablet Take 1 tablet (81 mg total) by mouth daily. 12/11/16   Milagros LollSudini, Srikar, MD  docusate sodium (COLACE) 100 MG capsule Take 100 mg by mouth daily. 06/25/19   [provider]  fluticasone (FLONASE) 50 MCG/ACT nasal spray Place 2 sprays into both nostrils daily. 10/21/19   [provider]  latanoprost (XALATAN) 0.005 % ophthalmic solution Place 1 drop into both eyes at bedtime.  09/19/14   [provider]  lisinopril (ZESTRIL) 20 MG tablet Take 20 mg by mouth daily. 11/18/19   [provider]  pantoprazole (PROTONIX) 40 MG tablet Take 1 tablet (40 mg total) by mouth daily. 11/26/19   Pennie BanterGriffith, Kelly A, DO  timolol (TIMOPTIC) 0.5 % ophthalmic solution Place 1 drop into both eyes daily. 11/06/18   [provider]    Physical Exam: Vitals:   09/16/22 0723 09/16/22 1352 09/16/22 1443 09/16/22 1517  BP: (!) 160/67 (!) 174/76 (!) 161/99 (!) 153/64  Pulse: 74 90 89 79  Resp: 16 20 17 18   Temp: (!) 97.5 F (36.4 C) 97.6 F (36.4 C) (!) 96.4 F (35.8 C) 97.8 F (36.6 C)  TempSrc: Oral Oral    SpO2: 94% 93% 97% 95%  Weight:      Height:       General: Not in acute distress HEENT:       Eyes: PERRL, EOMI, no scleral icterus.       ENT: No discharge from the ears and nose       Neck: No JVD, no bruit, no mass felt. Heme: No neck lymph node enlargement. Cardiac: S1/S2, RRR, No murmurs, No gallops or rubs. Respiratory: No rales, wheezing, rhonchi or rubs. GI: Soft, nondistended, nontender, no organomegaly, BS present. GU: No hematuria Ext: No pitting leg edema bilaterally. 1+DP/PT pulse bilaterally. Musculoskeletal: No joint deformities, No joint redness or warmth, no limitation of ROM in spin. Skin: has a skin tear in right elbow.   Neuro: Patient is confused, not talking, not interactive, PEERL, left gaze forced deviation, has right facial droop, right sided weakness.   Psych: Patient is not psychotic  Labs on Admission: I have personally reviewed following labs and imaging studies  CBC: Recent Labs  Lab 09/16/22 0723  WBC 7.6  NEUTROABS 4.0  HGB 14.7  HCT 43.9  MCV 93.2  PLT 256   Basic Metabolic Panel: Recent Labs  Lab 09/16/22 0723  NA 135  K 3.6  CL 107  CO2 23  GLUCOSE 108*  BUN 21  CREATININE 1.00  CALCIUM 9.2   GFR: Estimated Creatinine Clearance: 24.3 mL/min (by C-G formula based on SCr of 1 mg/dL). Liver  Function Tests: Recent Labs  Lab 09/16/22 0723  AST 23  ALT 13  ALKPHOS 100  BILITOT 0.7  PROT 7.6  ALBUMIN 3.4*   No results for input(s): "LIPASE", "AMYLASE" in the last 168 hours. No results for input(s): "AMMONIA" in the last 168 hours. Coagulation Profile: Recent Labs  Lab 09/16/22 0724  INR 1.1   Cardiac Enzymes: No results for input(s): "CKTOTAL", "CKMB", "CKMBINDEX", "TROPONINI" in the last 168 hours. BNP (last 3 results) No results for input(s): "PROBNP" in the last 8760 hours. HbA1C: No results for input(s): "HGBA1C" in the last 72 hours. CBG: Recent Labs  Lab 09/16/22 1511  GLUCAP 180*   Lipid Profile: No results for input(s): "CHOL", "HDL", "LDLCALC", "TRIG", "CHOLHDL", "LDLDIRECT" in  the last 72 hours. Thyroid Function Tests: No results for input(s): "TSH", "T4TOTAL", "FREET4", "T3FREE", "THYROIDAB" in the last 72 hours. Anemia Panel: No results for input(s): "VITAMINB12", "FOLATE", "FERRITIN", "TIBC", "IRON", "RETICCTPCT" in the last 72 hours. Urine analysis:    Component Value Date/Time   COLORURINE YELLOW (A) 09/16/2022 0831   APPEARANCEUR CLEAR (A) 09/16/2022 0831   LABSPEC 1.011 09/16/2022 0831   PHURINE 5.0 09/16/2022 0831   GLUCOSEU NEGATIVE 09/16/2022 0831   HGBUR NEGATIVE 09/16/2022 0831   BILIRUBINUR NEGATIVE 09/16/2022 0831   KETONESUR NEGATIVE 09/16/2022 0831   PROTEINUR NEGATIVE 09/16/2022 0831   NITRITE NEGATIVE 09/16/2022 0831   LEUKOCYTESUR NEGATIVE 09/16/2022 0831   Sepsis Labs: (procalcitonin:4,lacticidven:4) ) Recent Results (from the past 240 hour(s))  SARS Coronavirus 2 by RT PCR (hospital order, performed in [facility] hospital lab) *cepheid single result test* Anterior Nasal Swab     Status: None   Collection Time: 09/16/22  8:31 AM   Specimen: Anterior Nasal Swab  Result Value Ref Range Status   SARS Coronavirus 2 by RT PCR NEGATIVE NEGATIVE Final    Comment: (NOTE) SARS-CoV-2 target nucleic acids are NOT  DETECTED.  The SARS-CoV-2 RNA is generally detectable in upper and lower respiratory specimens during the acute phase of infection. The lowest concentration of SARS-CoV-2 viral copies this assay can detect is 250 copies / mL. A negative result does not preclude SARS-CoV-2 infection and should not be used as the sole basis for treatment or other patient management decisions.  A negative result may occur with improper specimen collection / handling, submission of specimen other than nasopharyngeal swab, presence of viral mutation(s) within the areas targeted by this assay, and inadequate number of viral copies (<250 copies / mL). A negative result must be combined with clinical observations, patient history, and epidemiological information.  Fact Sheet for Patients:   RoadLapTop.co.za  Fact Sheet for Healthcare Providers: http://kim-miller.com/  This test is not yet approved or  cleared by the [place] FDA and has been authorized for detection and/or diagnosis of SARS-CoV-2 by FDA under an Emergency Use Authorization (EUA).  This EUA will remain in effect (meaning this test can be used) for the duration of the COVID-19 declaration under Section 564(b)(1) of the Act, 21 U.S.C. section 360bbb-3(b)(1), unless the authorization is terminated or revoked sooner.  Performed at [facility], [address], [place], [place] [address]      Radiological Exams on Admission: DG Forearm Right  Result Date: 09/16/2022 CLINICAL DATA:  88 year old female with history of right arm pain after a fall. EXAM: RIGHT FOREARM - 2 VIEW COMPARISON:  No priors. FINDINGS: There is no evidence of fracture or other focal bone lesions. Soft tissues are unremarkable. IMPRESSION: Negative. Electronically Signed   By: Trudie Reed M.D.   On: 09/16/2022 09:09   DG Humerus Right  Result Date: 09/16/2022 CLINICAL DATA:  88 year old female with history  of right arm pain after a fall. EXAM: RIGHT HUMERUS - 2+ VIEW COMPARISON:  No priors. FINDINGS: There is no evidence of fracture or other focal bone lesions. Soft tissues are unremarkable. IMPRESSION: Negative. Electronically Signed   By: Trudie Reed M.D.   On: 09/16/2022 09:08   DG Chest 2 View  Result Date: 09/16/2022 CLINICAL DATA:  88 year old female with history of unwitnessed fall. EXAM: CHEST - 2 VIEW COMPARISON:  Chest x-ray 11/23/2019. FINDINGS: Lung volumes are low. No consolidative airspace disease. No pleural effusions. No pneumothorax. No pulmonary nodule or mass noted. Pulmonary  vasculature is normal. Heart size is mildly enlarged. Upper mediastinal contours are within normal limits. Atherosclerotic calcifications are noted in the thoracic aorta. Left-sided pacemaker device in place with lead tips projecting over the expected location of the right atrium and right ventricle. IMPRESSION: 1. No radiographic evidence of acute cardiopulmonary disease. 2. Mild cardiomegaly. 3. Aortic atherosclerosis. Electronically Signed   By: Trudie Reed M.D.   On: 09/16/2022 08:23   DG Pelvis 1-2 Views  Result Date: 09/16/2022 CLINICAL DATA:  88 year old female with history of trauma from a fall. EXAM: PELVIS - 1-2 VIEW COMPARISON:  No priors. FINDINGS: There is no evidence of pelvic fracture or diastasis. No pelvic bone lesions are seen. Degenerative changes of osteoarthritis are noted in the hip joints bilaterally. IMPRESSION: 1. No acute radiographic abnormality of the bony pelvis. Electronically Signed   By: Trudie Reed M.D.   On: 09/16/2022 08:22   CT Head Wo Contrast  Result Date: 09/16/2022 CLINICAL DATA:  88 year old female with history of trauma from a fall. EXAM: CT HEAD WITHOUT CONTRAST CT CERVICAL SPINE WITHOUT CONTRAST TECHNIQUE: Multidetector CT imaging of the head and cervical spine was performed following the standard protocol without intravenous contrast. Multiplanar CT image  reconstructions of the cervical spine were also generated. RADIATION DOSE REDUCTION: This exam was performed according to the departmental dose-optimization program which includes automated exposure control, adjustment of the mA and/or kV according to patient size and/or use of iterative reconstruction technique. COMPARISON:  Head CT 10/02/2016. FINDINGS: CT HEAD FINDINGS Brain: Mild cerebral and cerebellar atrophy. Patchy and confluent areas of decreased attenuation are noted throughout the deep and periventricular white matter of the cerebral hemispheres bilaterally, compatible with chronic microvascular ischemic disease. Several small well-defined foci of low attenuation are noted in the basal ganglia bilaterally, most evident in the right thalamus and left external capsule. Well-defined low-attenuation regions in the cerebellar hemispheres bilaterally, compatible with old infarcts. Physiologic calcifications in the basal ganglia bilaterally (left-greater-than-right). No evidence of acute infarction, hemorrhage, hydrocephalus, extra-axial collection or mass lesion/mass effect. Vascular: No hyperdense vessel or unexpected calcification. Skull: Normal. Negative for fracture or focal lesion. Sinuses/Orbits: No acute finding. Other: None. CT CERVICAL SPINE FINDINGS Alignment: Normal. Skull base and vertebrae: No acute fracture. No primary bone lesion or focal pathologic process. Soft tissues and spinal canal: No prevertebral fluid or swelling. No visible canal hematoma. Disc levels: Multilevel degenerative disc disease, most severe at C4-C5, C5-C6 and C6-C7. Severe multilevel facet arthropathy bilaterally. Upper chest: Unremarkable. Other: None. IMPRESSION: 1. No evidence of significant acute traumatic injury to the skull, brain or cervical spine. 2. Mild cerebral and cerebellar atrophy with chronic microvascular ischemic changes in the cerebral white matter, old lacunar infarcts in the basal ganglia, and old  bilateral cerebellar infarcts, as above, similar to the prior study. 3. Multilevel degenerative disc disease and cervical spondylosis, as above. Electronically Signed   By: Trudie Reed M.D.   On: 09/16/2022 08:21   CT Cervical Spine Wo Contrast  Result Date: 09/16/2022 CLINICAL DATA:  88 year old female with history of trauma from a fall. EXAM: CT HEAD WITHOUT CONTRAST CT CERVICAL SPINE WITHOUT CONTRAST TECHNIQUE: Multidetector CT imaging of the head and cervical spine was performed following the standard protocol without intravenous contrast. Multiplanar CT image reconstructions of the cervical spine were also generated. RADIATION DOSE REDUCTION: This exam was performed according to the departmental dose-optimization program which includes automated exposure control, adjustment of the mA and/or kV according to patient size and/or use of iterative  reconstruction technique. COMPARISON:  Head CT 10/02/2016. FINDINGS: CT HEAD FINDINGS Brain: Mild cerebral and cerebellar atrophy. Patchy and confluent areas of decreased attenuation are noted throughout the deep and periventricular white matter of the cerebral hemispheres bilaterally, compatible with chronic microvascular ischemic disease. Several small well-defined foci of low attenuation are noted in the basal ganglia bilaterally, most evident in the right thalamus and left external capsule. Well-defined low-attenuation regions in the cerebellar hemispheres bilaterally, compatible with old infarcts. Physiologic calcifications in the basal ganglia bilaterally (left-greater-than-right). No evidence of acute infarction, hemorrhage, hydrocephalus, extra-axial collection or mass lesion/mass effect. Vascular: No hyperdense vessel or unexpected calcification. Skull: Normal. Negative for fracture or focal lesion. Sinuses/Orbits: No acute finding. Other: None. CT CERVICAL SPINE FINDINGS Alignment: Normal. Skull base and vertebrae: No acute fracture. No primary bone  lesion or focal pathologic process. Soft tissues and spinal canal: No prevertebral fluid or swelling. No visible canal hematoma. Disc levels: Multilevel degenerative disc disease, most severe at C4-C5, C5-C6 and C6-C7. Severe multilevel facet arthropathy bilaterally. Upper chest: Unremarkable. Other: None. IMPRESSION: 1. No evidence of significant acute traumatic injury to the skull, brain or cervical spine. 2. Mild cerebral and cerebellar atrophy with chronic microvascular ischemic changes in the cerebral white matter, old lacunar infarcts in the basal ganglia, and old bilateral cerebellar infarcts, as above, similar to the prior study. 3. Multilevel degenerative disc disease and cervical spondylosis, as above. Electronically Signed   By: Trudie Reed M.D.   On: 09/16/2022 08:21      Assessment/Plan Principal Problem:   Stroke [facility]) Active Problems:   Fall   History of CVA (cerebrovascular accident)   Atrial fibrillation (HCC)   HTN (hypertension)   CAD (coronary artery disease)   Anxiety   Skin tear of right elbow without complication   Assessment and Plan:  Stroke: pt likely has large stroke.  Consulted Dr. Wilford Corner of neurology. Dr. Wilford Corner spoke with her daughter by phone.  Her daughter does not want heroic measures, no CTA or echo.  I also called her daughter, Aurea Graff. She agreed to have palliative care consult. Her daughter wants to talk to palliative care first before making final decision for transition to full comfort care.   - Admit to tele med bed as inpatient - will hold oral Bp meds to allow permissive HTN - ASA and lipitor - fasting lipid panel and HbA1c  - swallowing screen. If fails, will get SLP - PT/OT consult - prn morphine for pain (her daughter agreed with as needed morphine treatment) - prn Ativan IV for agitation  Fall -f/u precaution -PT/OT  History of CVA (cerebrovascular accident) -Aspirin and lipitor  Atrial fibrillation (HCC): Heart rate is 74.  Patient  is not on anticoagulants. -Telemonitoring  HTN (hypertension) -IV hydralazine as needed for SBP>220 or dBP>110,  CAD (coronary artery disease) -Aspirin  Anxiety -Continue home as needed Xanax  Skin tear of R elbow -Wound care consult    DVT ppx: SQ Lovenox  Code Status: DNR per her daughter (I discussed with daughter and explained the meaning of CODE STATUS. Pt will be DNR per her daughter) Family Communication:  Yes, patient's daughter at bed side.    Disposition Plan:  Anticipate discharge back to previous environment  Consults called:  none  Admission status and Level of care: Telemetry Medical:    as inpt   Dispo: The patient is from: Home              Anticipated d/c is to: to  be determined               Anticipated d/c date is: 2 days              Patient currently is not medically stable to d/c.    Severity of Illness:  The appropriate patient status for this patient is INPATIENT. Inpatient status is judged to be reasonable and necessary in order to provide the required intensity of service to ensure the patient's safety. The patient's presenting symptoms, physical exam findings, and initial radiographic and laboratory data in the context of their chronic comorbidities is felt to place them at high risk for further clinical deterioration. Furthermore, it is not anticipated that the patient will be medically stable for discharge from the hospital within 2 midnights of admission.   * I certify that at the point of admission it is my clinical judgment that the patient will require inpatient hospital care spanning beyond 2 midnights from the point of admission due to high intensity of service, high risk for further deterioration and high frequency of surveillance required.*                    Dispo: The patient is from: Home              Anticipated d/c is to: Home              Anticipated d/c date is: 1 day              Patient currently is not  medically stable to d/c.    Severity of Illness:  The appropriate patient status for this patient is OBSERVATION. Observation status is judged to be reasonable and necessary in order to provide the required intensity of service to ensure the patient's safety. The patient's presenting symptoms, physical exam findings, and initial radiographic and laboratory data in the context of their medical condition is felt to place them at decreased risk for further clinical deterioration. Furthermore, it is anticipated that the patient will be medically stable for discharge from the hospital within 2 midnights of admission.        Date of Service 09/16/2022    Lorretta Harp [facility]   If 7PM-7AM, please contact night-coverage [email or web address] 09/16/2022, 6:54 PM

## 2022-09-16 NOTE — ED Triage Notes (Signed)
Pt daughter now present and reports pt was in the bathroom and she heard a commotion and went in the BR and pt was laying against the bathtub in a sitting position.

## 2022-09-16 NOTE — ED Provider Notes (Addendum)
[facility] Provider Note    Event Date/Time   First MD Initiated Contact with Patient 09/16/22 [phone]     (approximate)   History   Fall and Abrasion   HPI  Brooke Potts is a 88 y.o. female  here with fall. Pt was in her usual state of health until a fall this morning. Pt was in the bathroom when family believes her cane got stuck on a bathroom rug, causing her to fall. She fell backwards and hit the tub with her head. Reports headache since the fall. She otherwise has been in her usual state of health throughout the past few days.No known recent fevers, chills, weakness, poor appetite, or other changes. She is not on anticoagulation. Denies any pain to me on my assessment. Denies any focal numbness or weakness.        Physical Exam   Triage Vital Signs: ED Triage Vitals  Enc Vitals Group     BP 09/16/22 0723 (!) 160/67     Pulse Rate 09/16/22 0723 74     Resp 09/16/22 0723 16     Temp 09/16/22 0723 (!) 97.5 F (36.4 C)     Temp Source 09/16/22 0723 Oral     SpO2 09/16/22 0723 94 %     Weight 09/16/22 0715 116 lb 13.5 oz (53 kg)     Height 09/16/22 0715 5\' 1"  (1.549 m)     Head Circumference --      Peak Flow --      Pain Score --      Pain Loc --      Pain Edu? --      Excl. in GC? --     Most recent vital signs: Vitals:   09/16/22 0723  BP: (!) 160/67  Pulse: 74  Resp: 16  Temp: (!) 97.5 F (36.4 C)  SpO2: 94%     General: Awake, no distress.  CV:  Good peripheral perfusion. RRR. No murmurs, rubs, or gallops. Resp:  Normal effort. Lungs clear. Abd:  No distention. No tenderness. Other:  Mild TTP over posterior scalp but no abrasions or lacerations noted. No midline C spine TTP. CNII-XII intact, strength 5/5 bl UE and LE, normal sensation to light touch. Does not answer questions but intermittently will follow commands. Withdraws to painful stimuli bl UE and LE. PERRL. She will not follow me on exam but eyes midline then avoid  light bilaterally, no overt gaze preference. Gait deferred.   MSK: Mild TTP over R distal upper arm over elbow, with superficial skin tear to elbow.   ED Results / Procedures / Treatments   Labs (all labs ordered are listed, but only abnormal results are displayed) Labs Reviewed  COMPREHENSIVE METABOLIC PANEL - Abnormal; Notable for the following components:      Result Value   Glucose, Bld 108 (*)    Albumin 3.4 (*)    GFR, Estimated 51 (*)    All other components within normal limits  URINALYSIS, ROUTINE W REFLEX MICROSCOPIC - Abnormal; Notable for the following components:   Color, Urine YELLOW (*)    APPearance CLEAR (*)    All other components within normal limits  SARS CORONAVIRUS 2 BY RT PCR  CBC WITH DIFFERENTIAL/PLATELET  BLOOD GAS, VENOUS  URINE DRUG SCREEN, QUALITATIVE ([facility] ONLY)  TROPONIN I (HIGH SENSITIVITY)  TROPONIN I (HIGH SENSITIVITY)     EKG    RADIOLOGY CT Head: Negative CT C spine: Negative CXR: CXR PXR:  No acute abnormality DG Elbow/Arm: Negative   I also independently reviewed and agree with radiologist interpretations.   PROCEDURES:  Critical Care performed: No   MEDICATIONS ORDERED IN ED: Medications - No data to display   IMPRESSION / MDM / [place] / ED COURSE  I reviewed the triage vital signs and the nursing notes.                              Differential diagnosis includes, but is not limited to, mechanical fall, generalized weakness 2/2 UTI, PNA, metabolic encephalopathy, AKI, polypharmacy, CVA, seizure/post-ictal state, concussion, SDH/TBI.  Patient's presentation is most consistent with acute presentation with potential threat to life or bodily function.  The patient is on the cardiac monitor to evaluate for evidence of arrhythmia and/or significant heart rate changes.  88 yo F with PMHx CAD, HTN, SSS, CVA, here with fall. Pt unable to provide history 2/2 confusion above her baseline. Labs reassuring. CBC  without leukocytosis or anemia. CMP unremarkable with normal renal function. COVID is negative. UA negative. Plain films negative for injury and CT Head/C spine reviewed by me and are negative.   Pt remains off her baseline per family. Lives w/ daughter who cannot take care of her. Though no focal deficits on exam concern for possible CVA given her h/o AFib not on anticoagulation versus concussion. Unable to obtain MRI at thsi time 2/2 PM. Will admit for obs/PT/OT and possible CVA w/u as able.    FINAL CLINICAL IMPRESSION(S) / ED DIAGNOSES   Final diagnoses:  Acute encephalopathy  Fall, initial encounter     Rx / DC Orders   ED Discharge Orders     None        Note:  This document was prepared using Dragon voice recognition software and may include unintentional dictation errors.   Duffy Bruce, MD 09/16/22 [id number]    Duffy Bruce, MD 09/16/22 [id number]

## 2022-09-16 NOTE — ED Notes (Signed)
X-ray at bedside

## 2022-09-17 DIAGNOSIS — I639 Cerebral infarction, unspecified: Secondary | ICD-10-CM

## 2022-09-17 MED ORDER — SODIUM CHLORIDE 0.9 % IV BOLUS
500.0000 mL | Freq: Once | INTRAVENOUS | Status: AC
  Administered 2022-09-17: 500 mL via INTRAVENOUS

## 2022-09-17 MED ORDER — SODIUM CHLORIDE 0.9 % IV SOLN: INTRAVENOUS | Status: DC

## 2022-09-17 NOTE — Progress Notes (Signed)
       CROSS COVER NOTE  NAME: Brooke Potts MRN: [id number] DOB : 31-May-1923 ATTENDING PHYSICIAN: Lorretta Harp, MD    Date of Service   09/17/2022   HPI/Events of Note   Message received from staff with concerns that M(r)s Hudlow is NPO and does not have IVF ordered. Per RN M(r)s Shimizu has had poor urinary output and only put out 50 mL since 3PM yesterday.  Interventions   Assessment/Plan:  NS bolus + Continuous IVF at 50mL/hr     This document was prepared using Dragon voice recognition software and may include unintentional dictation errors.  Bishop Limbo DNP, MBA, FNP-BC Nurse Practitioner [facility] Pager [phone]

## 2022-09-17 NOTE — Evaluation (Signed)
Occupational Therapy Evaluation Patient Details Name: Brooke Potts MRN: [id number] DOB: October 20, 1922 Today's Date: 09/17/2022   History of Present Illness 88 y/o presented to ED on 09/16/22 following fall and AMS. In ED, patient with R sided weakness, facial droop, and L gaze preference. CT head negative. Neurology states "exhibits signs of a large L MCA infarction." PMH: dementia, HTN, stroke, anxiety, pacemaker placement 2/2 SSS, CAD, Afib, vertigo   Clinical Impression   Pt seen for initial OT evaluation. Pt presents lethargic but wakes with sternal rub. Daughter at bedside indicates pt has been tired since PT session. Pt opens eyes but does not attempt to verbally communicate during session. Role of OT explained to daughters at bedside. Bot agreeable to move forward with OT evaluation to determine pt current functional status and need for assist. Pt remains generally lethargic t/o session but is able to follow 1 step VCs with multimodal cueing. She requires MAX-TOTAL A to perform UB Grooming with bed in chair position. See ADL section below for additional detail. Mobility deferred to maximize pt safety and comfort. Per PT note, pt requires MAX A to sit at EOB. Pt is far from her functional baseline and would benefit from OT services to maximize return to PLOF/independence with ADL management. Pt family waiting to discuss goals of care with palliative medicine.They are unsure if the will pursue comfort care vs. Rehabilitative model, but would like therapy services to continue while they await palliative consult. Will continue to follow pt for ongoing support while family determines their options. DC rec TBD pending family GOC.      Recommendations for follow up therapy are one component of a multi-disciplinary discharge planning process, led by the attending physician.  Recommendations may be updated based on patient status, additional functional criteria and insurance authorization.   Follow Up  Recommendations   (TBD pending family conversation with Palliative)     Assistance Recommended at Discharge    Patient can return home with the following      Functional Status Assessment     Equipment Recommendations       Recommendations for Other Services       Precautions / Restrictions Precautions Precautions: Fall Precaution Comments: L gaze preference, pusher Restrictions Weight Bearing Restrictions: No      Mobility Bed Mobility Overal bed mobility: Needs Assistance             General bed mobility comments: Deferred. Pt recently OOB with PT, family considering palliative care and eager to support pt comfort at this time. Completed bed level eval.    Transfers                          Balance Overall balance assessment: Needs assistance                                         ADL either performed or assessed with clinical judgement   ADL Overall ADL's : Needs assistance/impaired                                       General ADL Comments: Pt significantly functionally limited by generalized weakness, L sided weakness, decreased activity tolerance, and impaired cognition. Pt requires MAX-TOTAL A for UB grooming (oral care with mouth swab  and face washing). Anticipate MAX A with +2 for safety and physical assist for mobility.     Vision   Additional Comments: Pt unable to state/participate in testing. Maintains strict L gaze when eyes are open. Tends to keep eyes closed during session. Does not appear to visually track to midline.     Perception     Praxis      Pertinent Vitals/Pain Pain Assessment Pain Assessment: Faces Faces Pain Scale: Hurts little more Pain Location: RUE with movement Pain Descriptors / Indicators: Grimacing, Guarding (pulling away) Pain Intervention(s): Limited activity within patient's tolerance, Monitored during session, Repositioned     Hand Dominance Right    Extremity/Trunk Assessment Upper Extremity Assessment Upper Extremity Assessment: Generalized weakness;RUE deficits/detail;LUE deficits/detail;Difficult to assess due to impaired cognition RUE Deficits / Details: Per daughter RUE was injured during the fall (negative for acute fx.) and painful for pt to move. Pt tends to move LUE more actively than RUE despite suspected stroke impacting L side. LUE Deficits / Details: Pt able to pull LUE away with attempt at PROM.   Lower Extremity Assessment Lower Extremity Assessment: Generalized weakness;Difficult to assess due to impaired cognition;Defer to PT evaluation   Cervical / Trunk Assessment Cervical / Trunk Assessment: Kyphotic   Communication Communication Communication: Expressive difficulties;Receptive difficulties   Cognition Arousal/Alertness: Lethargic Behavior During Therapy: Flat affect Overall Cognitive Status: History of cognitive impairments - at baseline                                 General Comments: hx of dementia, not following commands (follows commands at baseline). No attempts at verbalization during session.     General Comments       Exercises Other Exercises Other Exercises: Pt education limited by cognition. Family at bedside educated on role of OT in acute setting and considerations for DC to rehab vs. hospice.   Shoulder Instructions      Home Living Family/patient expects to be discharged to:: Hospice/Palliative care Living Arrangements: Children                               Additional Comments: Family discussing transition to comfort care      Prior Functioning/Environment Prior Level of Function : Other (comment)             Mobility Comments: daughter reports patient being able to mobilize without assistance ADLs Comments: Per daughter pt was generally independent with ADL management.        OT Problem List: Decreased strength;Decreased coordination;Decreased  range of motion;Decreased activity tolerance;Decreased safety awareness;Impaired balance (sitting and/or standing);Decreased knowledge of use of DME or AE;Impaired UE functional use;Impaired vision/perception;Pain;Decreased cognition      OT Treatment/Interventions: Self-care/ADL training;Therapeutic exercise;Therapeutic activities;DME and/or AE instruction;Patient/family education;Energy conservation;Cognitive remediation/compensation    OT Goals(Current goals can be found in the care plan section) Acute Rehab OT Goals Patient Stated Goal: none stated Time For Goal Achievement: 10/01/22 ADL Goals Pt Will Perform Grooming: sitting;with set-up;with mod assist Pt Will Perform Lower Body Dressing: sit to/from stand;with mod assist Pt Will Transfer to Toilet: stand pivot transfer;with mod assist;bedside commode Pt Will Perform Toileting - Clothing Manipulation and hygiene: with adaptive equipment;sitting/lateral leans;with mod assist  OT Frequency: Min 2X/week    Co-evaluation              AM-PAC OT "6 Clicks" Daily Activity  Outcome Measure Help from another person eating meals?: A Lot (based on function. Currently NPO) Help from another person taking care of personal grooming?: A Lot Help from another person toileting, which includes using toliet, bedpan, or urinal?: Total Help from another person bathing (including washing, rinsing, drying)?: Total Help from another person to put on and taking off regular upper body clothing?: A Lot Help from another person to put on and taking off regular lower body clothing?: Total 6 Click Score: 9   End of Session    Activity Tolerance: Patient limited by fatigue;Patient limited by lethargy Patient left: in bed;with call bell/phone within reach;with bed alarm set;with family/visitor present  OT Visit Diagnosis: Other abnormalities of gait and mobility (R26.89);Other symptoms and signs involving cognitive function;Hemiplegia and  hemiparesis Hemiplegia - Right/Left: Left Hemiplegia - dominant/non-dominant: Non-Dominant Hemiplegia - caused by: Cerebral infarction                Time: 9381-8299 OT Time Calculation (min): 21 min Charges:  OT General Charges $OT Visit: 1 Visit OT Evaluation $OT Eval Moderate Complexity: 1 Mod OT Treatments $Self Care/Home Management : 8-22 mins  Rockney Ghee, M.S., OTR/L 09/17/22, 1:05 PM

## 2022-09-17 NOTE — Evaluation (Signed)
Physical Therapy Evaluation Patient Details Name: Brooke Potts MRN: [id number] DOB: 1922-11-28 Today's Date: 09/17/2022  History of Present Illness  88 y/o presented to ED on 09/16/22 following fall and AMS. In ED, patient with R sided weakness, facial droop, and L gaze preference. CT head negative. Neurology states "exhibits signs of a large L MCA infarction." PMH: dementia, HTN, stroke, anxiety, pacemaker placement 2/2 SSS, CAD, Afib, vertigo  Clinical Impression  Patient seen for PT evaluation with daughter present. Educated daughter on role of PT, daughter agreeable to therapy evaluation despite awaiting palliative consult to discuss further measures. Daughter present throughout session and participatory in attempt to engage patient due to familiarity of faces. Patient presents with R sided weakness, impaired communication, impaired cognition, impaired sitting balance, and decreased activity tolerance. Required totalA for bed mobility but patient spontaneously moving R LE off bed, however not on command. In sitting, patient pushing towards R side despite attempts to distract L hand. Once returned to supine and in trendelenburg, patient demos slight initiation to scooting towards [facility] but quickly fatigues and unable to continue with commands and visual cues. Patient will benefit from skilled PT services during acute stay to address listed deficits. Will follow acutely until family decision is made on next steps of care.        Recommendations for follow up therapy are one component of a multi-disciplinary discharge planning process, led by the attending physician.  Recommendations may be updated based on patient status, additional functional criteria and insurance authorization.  Follow Up Recommendations Other (comment) (pending palliative discussion with family)      Assistance Recommended at Discharge Frequent or constant Supervision/Assistance  Patient can return home with the following   Two people to help with walking and/or transfers;Two people to help with bathing/dressing/bathroom    Equipment Recommendations Hospital bed;Wheelchair cushion (measurements PT);Wheelchair (measurements PT);Other (comment) (hoyer lift)  Recommendations for Other Services       Functional Status Assessment Patient has had a recent decline in their functional status and/or demonstrates limited ability to make significant improvements in function in a reasonable and predictable amount of time     Precautions / Restrictions Precautions Precautions: Fall Precaution Comments: L gaze preference, pusher Restrictions Weight Bearing Restrictions: No      Mobility  Bed Mobility Overal bed mobility: Needs Assistance Bed Mobility: Supine to Sit, Sit to Supine     Supine to sit: Total assist Sit to supine: Total assist   General bed mobility comments: patient spontaneously moving R LE off bed but ultimately totalA to come to EOB and return to supine. Once in supine, placed patient in trendelenburg and patient initiating scooting up but does not continue with commands or visual cues.    Transfers                        Ambulation/Gait                  Stairs            Wheelchair Mobility    Modified Rankin (Stroke Patients Only)       Balance Overall balance assessment: Needs assistance Sitting-balance support: Single extremity supported, Feet supported Sitting balance-Leahy Scale: Zero Sitting balance - Comments: patient pushing with L UE despite attempts to distract L UE. Heavy R lateral and posterior lean. totalA to maintain sitting balance Postural control: Right lateral lean, Posterior lean  Pertinent Vitals/Pain Pain Assessment Pain Assessment: Faces Faces Pain Scale: No hurt Pain Intervention(s): Monitored during session    Home Living Family/patient expects to be discharged to::  Hospice/Palliative care Living Arrangements: Children                 Additional Comments: Family discussing transition to comfort care    Prior Function Prior Level of Function : Other (comment)             Mobility Comments: daughter reports patient being able to mobilize without assistance       Hand Dominance        Extremity/Trunk Assessment   Upper Extremity Assessment Upper Extremity Assessment: Defer to OT evaluation    Lower Extremity Assessment Lower Extremity Assessment: Generalized weakness;Difficult to assess due to impaired cognition (spontaneously moving R LE but not on command)    Cervical / Trunk Assessment Cervical / Trunk Assessment: Kyphotic  Communication   Communication: Expressive difficulties;Receptive difficulties  Cognition Arousal/Alertness: Awake/alert Behavior During Therapy: Flat affect Overall Cognitive Status: History of cognitive impairments - at baseline                                 General Comments: hx of dementia, not following commands (follows commands at baseline). No attempts at verbalization during session        General Comments      Exercises     Assessment/Plan    PT Assessment Patient needs continued PT services  PT Problem List Decreased strength;Decreased activity tolerance;Decreased balance;Decreased mobility;Decreased coordination;Decreased cognition;Decreased knowledge of use of DME;Decreased safety awareness;Decreased knowledge of precautions       PT Treatment Interventions DME instruction;Gait training;Therapeutic activities;Functional mobility training;Therapeutic exercise;Balance training;Patient/family education    PT Goals (Current goals can be found in the Care Plan section)  Acute Rehab PT Goals Patient Stated Goal: unable to state PT Goal Formulation: With family Time For Goal Achievement: 10/01/22 Potential to Achieve Goals: Poor Additional Goals Additional Goal #1:  Patient will follow ~50% of commands during mobility task/session to improve awareness and overall function.    Frequency Min 2X/week     Co-evaluation               AM-PAC PT "6 Clicks" Mobility  Outcome Measure Help needed turning from your back to your side while in a flat bed without using bedrails?: Total Help needed moving from lying on your back to sitting on the side of a flat bed without using bedrails?: Total Help needed moving to and from a bed to a chair (including a wheelchair)?: Total Help needed standing up from a chair using your arms (e.g., wheelchair or bedside chair)?: Total Help needed to walk in hospital room?: Total Help needed climbing 3-5 steps with a railing? : Total 6 Click Score: 6    End of Session Equipment Utilized During Treatment: Oxygen Activity Tolerance: Patient tolerated treatment well Patient left: in bed;with bed alarm set;with call bell/phone within reach;with family/visitor present Nurse Communication: Mobility status PT Visit Diagnosis: Muscle weakness (generalized) (M62.81);Unsteadiness on feet (R26.81);Other abnormalities of gait and mobility (R26.89);Other symptoms and signs involving the nervous system (R29.898);Hemiplegia and hemiparesis Hemiplegia - Right/Left: Right Hemiplegia - dominant/non-dominant: Dominant Hemiplegia - caused by: Cerebral infarction    Time: 0936-1000 PT Time Calculation (min) (ACUTE ONLY): 24 min   Charges:   PT Evaluation $PT Eval Moderate Complexity: 1 Mod PT Treatments $Therapeutic Activity: 8-22 mins  Jesica Goheen A. Dan Humphreys PT, DPT [facility] - Acute Rehabilitation Services   Jasiel Apachito A Kirsti Mcalpine 09/17/2022, 10:17 AM

## 2022-09-17 NOTE — Consult Note (Addendum)
WOC Nurse Consult Note: Reason for Consult: Consult requested for right elbow.  Pt has 2 areas of partial thickness skin tears; each one is approx 1X1X.1cm.  One is dark red dried blood, other is red moist wound bed, no drainage or bleeding. Skin approximated over 50% of the wounds.  Dressing procedure/placement/frequency: Topical treatment orders provided for bedside nurses to perform as follows to decrease adherence and promote moist healing: Apply xeroform gauze to right elbow wounds (2) Q day and cover with foam dressing.  (Change foam dressing Q 3 days or PRN soiling.) Please re-consult if further assistance is needed.  Thank-you,  Cammie Mcgee MSN, RN, CWOCN, [place], CNS [phone]

## 2022-09-17 NOTE — Plan of Care (Signed)
  Problem: Pain Managment: Goal: General experience of comfort will improve Outcome: Progressing   Problem: Safety: Goal: Ability to remain free from injury will improve Outcome: Progressing   

## 2022-09-17 NOTE — Progress Notes (Signed)
Civil engineer, contracting [facility]) Hospital Liaison Note  Received request from Transitions of Care Manager Chana Bode for family interest in Hospice Home. Visited patient at bedside and spoke with daughter/Joan via TC to confirm interest and explain services.  Approval for Hospice Home is determined by [facility] MD & patient has been approved for transfer to the Hospice Home. Family is agreeable to transfer today.   Consent forms have been completed.  EMS notified of patient D/C and transport arranged for 4:30 pm by MSW. TOC and Attending Physician/Dr. Nelson Chimes also notified of transport arrangement.    Please send signed DNR form with patient and RN call report to [phone].    Odette Fraction, MSW [facility] Liaison [phone]

## 2022-09-17 NOTE — Progress Notes (Signed)
SLP Cancellation Note  Patient Details Name: Brooke Potts MRN: [id number] DOB: [birth year]   Cancelled treatment:       Reason Eval/Treat Not Completed: SLP screened, no needs identified, will sign off   SLP consult received and appreciated. Chart review completed. Consulted with RN. Per RN, pt transitioning to comfort care with possible d/c to hospice care. SLP to sign off at this time. RN aware and in agreement.  [name], M.S., CCC-SLP Speech-Language Pathologist [facility] [phone] Arnette Felts)   Woodroe Chen 09/17/2022, 11:10 AM

## 2022-09-17 NOTE — Plan of Care (Signed)
  Problem: Education: Goal: Knowledge of General Education information will improve Description: Including pain rating scale, medication(s)/side effects and non-pharmacologic comfort measures Outcome: Progressing   Problem: Health Behavior/Discharge Planning: Goal: Ability to manage health-related needs will improve Outcome: Progressing   Problem: Clinical Measurements: Goal: Ability to maintain clinical measurements within normal limits will improve Outcome: Progressing Goal: Will remain free from infection Outcome: Progressing Goal: Diagnostic test results will improve Outcome: Progressing Goal: Respiratory complications will improve Outcome: Progressing Goal: Cardiovascular complication will be avoided Outcome: Progressing   Problem: Activity: Goal: Risk for activity intolerance will decrease Outcome: Progressing   Problem: Nutrition: Goal: Adequate nutrition will be maintained Outcome: Progressing   Problem: Coping: Goal: Level of anxiety will decrease Outcome: Progressing   Problem: Elimination: Goal: Will not experience complications related to bowel motility Outcome: Progressing Goal: Will not experience complications related to urinary retention Outcome: Progressing   Problem: Pain Managment: Goal: General experience of comfort will improve Outcome: Progressing   Problem: Safety: Goal: Ability to remain free from injury will improve Outcome: Progressing   Problem: Skin Integrity: Goal: Risk for impaired skin integrity will decrease Outcome: Progressing   Problem: Education: Goal: Knowledge of disease or condition will improve Outcome: Progressing Goal: Knowledge of secondary prevention will improve (MUST DOCUMENT ALL) Outcome: Progressing Goal: Knowledge of patient specific risk factors will improve (Mark N/A or DELETE if not current risk factor) Outcome: Progressing   Problem: Ischemic Stroke/TIA Tissue Perfusion: Goal: Complications of ischemic  stroke/TIA will be minimized Outcome: Progressing   Problem: Coping: Goal: Will verbalize positive feelings about self Outcome: Progressing Goal: Will identify appropriate support needs Outcome: Progressing   Problem: Health Behavior/Discharge Planning: Goal: Ability to manage health-related needs will improve Outcome: Progressing Goal: Goals will be collaboratively established with patient/family Outcome: Progressing   Problem: Self-Care: Goal: Ability to participate in self-care as condition permits will improve Outcome: Progressing Goal: Verbalization of feelings and concerns over difficulty with self-care will improve Outcome: Progressing Goal: Ability to communicate needs accurately will improve Outcome: Progressing   Problem: Nutrition: Goal: Risk of aspiration will decrease Outcome: Progressing Goal: Dietary intake will improve Outcome: Progressing   Problem: Education: Goal: Knowledge of disease or condition will improve Outcome: Progressing Goal: Knowledge of secondary prevention will improve (MUST DOCUMENT ALL) Outcome: Progressing Goal: Knowledge of patient specific risk factors will improve (Mark N/A or DELETE if not current risk factor) Outcome: Progressing   Problem: Ischemic Stroke/TIA Tissue Perfusion: Goal: Complications of ischemic stroke/TIA will be minimized Outcome: Progressing   Problem: Coping: Goal: Will verbalize positive feelings about self Outcome: Progressing Goal: Will identify appropriate support needs Outcome: Progressing   Problem: Health Behavior/Discharge Planning: Goal: Ability to manage health-related needs will improve Outcome: Progressing Goal: Goals will be collaboratively established with patient/family Outcome: Progressing   Problem: Self-Care: Goal: Ability to participate in self-care as condition permits will improve Outcome: Progressing Goal: Verbalization of feelings and concerns over difficulty with self-care will  improve Outcome: Progressing Goal: Ability to communicate needs accurately will improve Outcome: Progressing   Problem: Nutrition: Goal: Risk of aspiration will decrease Outcome: Progressing Goal: Dietary intake will improve Outcome: Progressing   

## 2022-09-17 NOTE — Hospital Course (Addendum)
Taken from H&P.   Brooke Potts is a 88 y.o. female with medical history significant of dementia, hypertension, stroke, GERD, anxiety, pacemaker placement due to SSS,, CAD, atrial fibrillation not on anticoagulants, GI bleeding, vertigo, who presents with fall and altered mental status.   Per her daughter at the bedside, patient fell this morning, hit her head and right arm.  Patient has skin tear in right elbow area.  Daughter states that at normal baseline, patient recognizes her, knows the place, interacting and talking to her, usually not orientated to the time.  Today patient is more confused.  She is not orientated to the place.  She is not talking and not interactive today. Patient does not seem to have chest pain, cough, shortness breath, no nausea vomiting, diarrhea or abdominal pain, no symptoms of UTI per her daughter.   Per EDP, initially pt withdrew to stimuli bl UE and LE this morning, no eye deviation though she might have had a mild L gaze preference. Later on, pt was noted to have right side sided weakness, facial droop and frank forced leftward gaze.    Data reviewed independently and ED Course: pt was found to have WBC 7.6, troponin level 8, negative COVID PCR, negative urinalysis, creatinine 1.0, BUN 21, temperature 97.5, blood pressure 160/67, heart rate 74, RR 16, oxygen saturation 94% on room air.  Images are negative for acute injury including CT of head, CT of C-spine, chest x-ray, x-ray of pelvis, right humerus and right forearm. Patient was found to have a small skin tear around right elbow.  Neurology was consulted for concern of stroke.  When discussed with daughter and according to her patient does not want any heroic or aggressive measures.  Per neurology note patient now exhibiting signs of large left MCA territory infarct, can be cardioembolic but she was outside the window for thrombolysis and poor baseline preclude thrombectomy. Family refused further investigations  which include CTA, MRI or echo. They would like to transition to comfort measures only.  12/4: Discussed with daughter at bedside.  Patient with not much significant response, left gaze preference.  Concern of large left MCA infarct but family does not want any more investigation and would like to transition her to comfort care.  They would like her to go to hospice facility.  Hospice services were consulted and she is approved for hospice facility, where she is being discharged for end-of-life care.

## 2022-09-17 NOTE — TOC Transition Note (Signed)
Transition of Care [facility]) - CM/SW Discharge Note   Patient Details  Name: Brooke Potts MRN: [id number] Date of Birth: 1923/08/10  Transition of Care [facility]) CM/SW Contact:  Truddie Hidden, RN Phone Number: 09/17/2022, 3:58 PM   Clinical Narrative:    Referral sent and accepted by Odette Fraction from [facility]. Patient was approved for inpatient hospice. Face sheet and medical necessity forms completed and printed to floor to be added to EMS packet. DNR added to packet as well.  TOC signing off.          Patient Goals and CMS Choice        Discharge Placement                       Discharge Plan and Services                                     Social Determinants of Health (SDOH) Interventions     Readmission Risk Interventions     No data to display

## 2022-09-17 NOTE — Discharge Summary (Signed)
Physician Discharge Summary   Patient: Brooke Potts MRN: [id number] DOB: [birth year]  Admit date:     09/16/2022  Discharge date: 09/17/22  Discharge Physician: Arnetha Courser   PCP: Gracelyn Nurse, MD   Recommendations at discharge:  Patient is being discharged to hospice home  Discharge Diagnoses: Principal Problem:   Stroke [facility]) Active Problems:   Fall   History of CVA (cerebrovascular accident)   Atrial fibrillation (HCC)   HTN (hypertension)   CAD (coronary artery disease)   Anxiety   Skin tear of right elbow without complication   Hospital Course: Taken from H&P.   Brooke Potts is a 88 y.o. female with medical history significant of dementia, hypertension, stroke, GERD, anxiety, pacemaker placement due to SSS,, CAD, atrial fibrillation not on anticoagulants, GI bleeding, vertigo, who presents with fall and altered mental status.   Per her daughter at the bedside, patient fell this morning, hit her head and right arm.  Patient has skin tear in right elbow area.  Daughter states that at normal baseline, patient recognizes her, knows the place, interacting and talking to her, usually not orientated to the time.  Today patient is more confused.  She is not orientated to the place.  She is not talking and not interactive today. Patient does not seem to have chest pain, cough, shortness breath, no nausea vomiting, diarrhea or abdominal pain, no symptoms of UTI per her daughter.   Per EDP, initially pt withdrew to stimuli bl UE and LE this morning, no eye deviation though she might have had a mild L gaze preference. Later on, pt was noted to have right side sided weakness, facial droop and frank forced leftward gaze.    Data reviewed independently and ED Course: pt was found to have WBC 7.6, troponin level 8, negative COVID PCR, negative urinalysis, creatinine 1.0, BUN 21, temperature 97.5, blood pressure 160/67, heart rate 74, RR 16, oxygen saturation 94% on room air.   Images are negative for acute injury including CT of head, CT of C-spine, chest x-ray, x-ray of pelvis, right humerus and right forearm. Patient was found to have a small skin tear around right elbow.  Neurology was consulted for concern of stroke.  When discussed with daughter and according to her patient does not want any heroic or aggressive measures.  Per neurology note patient now exhibiting signs of large left MCA territory infarct, can be cardioembolic but she was outside the window for thrombolysis and poor baseline preclude thrombectomy. Family refused further investigations which include CTA, MRI or echo. They would like to transition to comfort measures only.  12/4: Discussed with daughter at bedside.  Patient with not much significant response, left gaze preference.  Concern of large left MCA infarct but family does not want any more investigation and would like to transition her to comfort care.  They would like her to go to hospice facility.  Hospice services were consulted and she is approved for hospice facility, where she is being discharged for end-of-life care.   Consultants: Neurology Procedures performed: None Disposition: Hospice care Diet recommendation:  Discharge Diet Orders (From admission, onward)     Start     Ordered   09/17/22 0000  Diet - low sodium heart healthy        09/17/22 1415           Dysphagia type 1 thin Liquid DISCHARGE MEDICATION: Allergies as of 09/17/2022       Reactions   Tramadol Nausea  And Vomiting, Nausea Only        Medication List     STOP taking these medications    aspirin EC 81 MG tablet   lisinopril 20 MG tablet Commonly known as: ZESTRIL   pantoprazole 40 MG tablet Commonly known as: PROTONIX       TAKE these medications    ALPRAZolam 0.25 MG tablet Commonly known as: XANAX Take 0.125-0.25 mg by mouth See admin instructions. Take  tablet (0.125mg ) by mouth every morning and take 1 tablet (0.25mg ) by mouth  at bedtime   amLODipine 5 MG tablet Commonly known as: NORVASC Take 5 mg by mouth daily.   docusate sodium 100 MG capsule Commonly known as: COLACE Take 100 mg by mouth daily.   fluticasone 50 MCG/ACT nasal spray Commonly known as: FLONASE Place 2 sprays into both nostrils daily.   latanoprost 0.005 % ophthalmic solution Commonly known as: XALATAN Place 1 drop into both eyes at bedtime.   timolol 0.5 % ophthalmic solution Commonly known as: TIMOPTIC Place 1 drop into both eyes daily.               Discharge Care Instructions  (From admission, onward)           Start     Ordered   09/17/22 0000  Discharge wound care:       Comments: Apply xeroform gauze to right elbow wounds (2) Q day and cover with foam dressing.  (Change foam dressing Q 3 days or PRN soiling.   09/17/22 1415            Discharge Exam: Filed Weights   09/16/22 0715  Weight: 53 kg   General.  Frail and lethargic elderly lady, in no acute distress. Pulmonary.  Lungs clear bilaterally, normal respiratory effort. CV.  Regular rate and rhythm, no JVD, rub or murmur. Abdomen.  Soft, nontender, nondistended, BS positive. CNS.  Awake, strong left gaze preference, not following any commands Extremities.  No edema, no cyanosis, pulses intact and symmetrical.  Condition at discharge: stable  The results of significant diagnostics from this hospitalization (including imaging, microbiology, ancillary and laboratory) are listed below for reference.   Imaging Studies: DG Forearm Right  Result Date: 09/16/2022 CLINICAL DATA:  88 year old female with history of right arm pain after a fall. EXAM: RIGHT FOREARM - 2 VIEW COMPARISON:  No priors. FINDINGS: There is no evidence of fracture or other focal bone lesions. Soft tissues are unremarkable. IMPRESSION: Negative. Electronically Signed   By: Trudie Reed M.D.   On: 09/16/2022 09:09   DG Humerus Right  Result Date: 09/16/2022 CLINICAL DATA:   88 year old female with history of right arm pain after a fall. EXAM: RIGHT HUMERUS - 2+ VIEW COMPARISON:  No priors. FINDINGS: There is no evidence of fracture or other focal bone lesions. Soft tissues are unremarkable. IMPRESSION: Negative. Electronically Signed   By: Trudie Reed M.D.   On: 09/16/2022 09:08   DG Chest 2 View  Result Date: 09/16/2022 CLINICAL DATA:  88 year old female with history of unwitnessed fall. EXAM: CHEST - 2 VIEW COMPARISON:  Chest x-ray 11/23/2019. FINDINGS: Lung volumes are low. No consolidative airspace disease. No pleural effusions. No pneumothorax. No pulmonary nodule or mass noted. Pulmonary vasculature is normal. Heart size is mildly enlarged. Upper mediastinal contours are within normal limits. Atherosclerotic calcifications are noted in the thoracic aorta. Left-sided pacemaker device in place with lead tips projecting over the expected location of the right atrium and right ventricle. IMPRESSION:  1. No radiographic evidence of acute cardiopulmonary disease. 2. Mild cardiomegaly. 3. Aortic atherosclerosis. Electronically Signed   By: Trudie Reed M.D.   On: 09/16/2022 08:23   DG Pelvis 1-2 Views  Result Date: 09/16/2022 CLINICAL DATA:  88 year old female with history of trauma from a fall. EXAM: PELVIS - 1-2 VIEW COMPARISON:  No priors. FINDINGS: There is no evidence of pelvic fracture or diastasis. No pelvic bone lesions are seen. Degenerative changes of osteoarthritis are noted in the hip joints bilaterally. IMPRESSION: 1. No acute radiographic abnormality of the bony pelvis. Electronically Signed   By: Trudie Reed M.D.   On: 09/16/2022 08:22   CT Head Wo Contrast  Result Date: 09/16/2022 CLINICAL DATA:  88 year old female with history of trauma from a fall. EXAM: CT HEAD WITHOUT CONTRAST CT CERVICAL SPINE WITHOUT CONTRAST TECHNIQUE: Multidetector CT imaging of the head and cervical spine was performed following the standard protocol without intravenous  contrast. Multiplanar CT image reconstructions of the cervical spine were also generated. RADIATION DOSE REDUCTION: This exam was performed according to the departmental dose-optimization program which includes automated exposure control, adjustment of the mA and/or kV according to patient size and/or use of iterative reconstruction technique. COMPARISON:  Head CT 10/02/2016. FINDINGS: CT HEAD FINDINGS Brain: Mild cerebral and cerebellar atrophy. Patchy and confluent areas of decreased attenuation are noted throughout the deep and periventricular white matter of the cerebral hemispheres bilaterally, compatible with chronic microvascular ischemic disease. Several small well-defined foci of low attenuation are noted in the basal ganglia bilaterally, most evident in the right thalamus and left external capsule. Well-defined low-attenuation regions in the cerebellar hemispheres bilaterally, compatible with old infarcts. Physiologic calcifications in the basal ganglia bilaterally (left-greater-than-right). No evidence of acute infarction, hemorrhage, hydrocephalus, extra-axial collection or mass lesion/mass effect. Vascular: No hyperdense vessel or unexpected calcification. Skull: Normal. Negative for fracture or focal lesion. Sinuses/Orbits: No acute finding. Other: None. CT CERVICAL SPINE FINDINGS Alignment: Normal. Skull base and vertebrae: No acute fracture. No primary bone lesion or focal pathologic process. Soft tissues and spinal canal: No prevertebral fluid or swelling. No visible canal hematoma. Disc levels: Multilevel degenerative disc disease, most severe at C4-C5, C5-C6 and C6-C7. Severe multilevel facet arthropathy bilaterally. Upper chest: Unremarkable. Other: None. IMPRESSION: 1. No evidence of significant acute traumatic injury to the skull, brain or cervical spine. 2. Mild cerebral and cerebellar atrophy with chronic microvascular ischemic changes in the cerebral white matter, old lacunar infarcts in the  basal ganglia, and old bilateral cerebellar infarcts, as above, similar to the prior study. 3. Multilevel degenerative disc disease and cervical spondylosis, as above. Electronically Signed   By: Trudie Reed M.D.   On: 09/16/2022 08:21   CT Cervical Spine Wo Contrast  Result Date: 09/16/2022 CLINICAL DATA:  88 year old female with history of trauma from a fall. EXAM: CT HEAD WITHOUT CONTRAST CT CERVICAL SPINE WITHOUT CONTRAST TECHNIQUE: Multidetector CT imaging of the head and cervical spine was performed following the standard protocol without intravenous contrast. Multiplanar CT image reconstructions of the cervical spine were also generated. RADIATION DOSE REDUCTION: This exam was performed according to the departmental dose-optimization program which includes automated exposure control, adjustment of the mA and/or kV according to patient size and/or use of iterative reconstruction technique. COMPARISON:  Head CT 10/02/2016. FINDINGS: CT HEAD FINDINGS Brain: Mild cerebral and cerebellar atrophy. Patchy and confluent areas of decreased attenuation are noted throughout the deep and periventricular white matter of the cerebral hemispheres bilaterally, compatible with chronic microvascular ischemic disease.  Several small well-defined foci of low attenuation are noted in the basal ganglia bilaterally, most evident in the right thalamus and left external capsule. Well-defined low-attenuation regions in the cerebellar hemispheres bilaterally, compatible with old infarcts. Physiologic calcifications in the basal ganglia bilaterally (left-greater-than-right). No evidence of acute infarction, hemorrhage, hydrocephalus, extra-axial collection or mass lesion/mass effect. Vascular: No hyperdense vessel or unexpected calcification. Skull: Normal. Negative for fracture or focal lesion. Sinuses/Orbits: No acute finding. Other: None. CT CERVICAL SPINE FINDINGS Alignment: Normal. Skull base and vertebrae: No acute  fracture. No primary bone lesion or focal pathologic process. Soft tissues and spinal canal: No prevertebral fluid or swelling. No visible canal hematoma. Disc levels: Multilevel degenerative disc disease, most severe at C4-C5, C5-C6 and C6-C7. Severe multilevel facet arthropathy bilaterally. Upper chest: Unremarkable. Other: None. IMPRESSION: 1. No evidence of significant acute traumatic injury to the skull, brain or cervical spine. 2. Mild cerebral and cerebellar atrophy with chronic microvascular ischemic changes in the cerebral white matter, old lacunar infarcts in the basal ganglia, and old bilateral cerebellar infarcts, as above, similar to the prior study. 3. Multilevel degenerative disc disease and cervical spondylosis, as above. Electronically Signed   By: Trudie Reed M.D.   On: 09/16/2022 08:21    Microbiology: Results for orders placed or performed during the hospital encounter of 09/16/22  SARS Coronavirus 2 by RT PCR (hospital order, performed in [facility] hospital lab) *cepheid single result test* Anterior Nasal Swab     Status: None   Collection Time: 09/16/22  8:31 AM   Specimen: Anterior Nasal Swab  Result Value Ref Range Status   SARS Coronavirus 2 by RT PCR NEGATIVE NEGATIVE Final    Comment: (NOTE) SARS-CoV-2 target nucleic acids are NOT DETECTED.  The SARS-CoV-2 RNA is generally detectable in upper and lower respiratory specimens during the acute phase of infection. The lowest concentration of SARS-CoV-2 viral copies this assay can detect is 250 copies / mL. A negative result does not preclude SARS-CoV-2 infection and should not be used as the sole basis for treatment or other patient management decisions.  A negative result may occur with improper specimen collection / handling, submission of specimen other than nasopharyngeal swab, presence of viral mutation(s) within the areas targeted by this assay, and inadequate number of viral copies (<250 copies / mL). A  negative result must be combined with clinical observations, patient history, and epidemiological information.  Fact Sheet for Patients:   RoadLapTop.co.za  Fact Sheet for Healthcare Providers: http://kim-miller.com/  This test is not yet approved or  cleared by the [place] FDA and has been authorized for detection and/or diagnosis of SARS-CoV-2 by FDA under an Emergency Use Authorization (EUA).  This EUA will remain in effect (meaning this test can be used) for the duration of the COVID-19 declaration under Section 564(b)(1) of the Act, 21 U.S.C. section 360bbb-3(b)(1), unless the authorization is terminated or revoked sooner.  Performed at [facility], [address] [place], [place] [address]     Labs: CBC: Recent Labs  Lab 09/16/22 0723  WBC 7.6  NEUTROABS 4.0  HGB 14.7  HCT 43.9  MCV 93.2  PLT 256   Basic Metabolic Panel: Recent Labs  Lab 09/16/22 0723  NA 135  K 3.6  CL 107  CO2 23  GLUCOSE 108*  BUN 21  CREATININE 1.00  CALCIUM 9.2   Liver Function Tests: Recent Labs  Lab 09/16/22 0723  AST 23  ALT 13  ALKPHOS 100  BILITOT 0.7  PROT 7.6  ALBUMIN 3.4*   CBG: Recent Labs  Lab 09/16/22 1511  GLUCAP 180*    Discharge time spent: greater than 30 minutes.  This record has been created using Conservation officer, historic buildingsDragon voice recognition software. Errors have been sought and corrected,but may not always be located. Such creation errors do not reflect on the standard of care.   Signed: Arnetha CourserSumayya Thaily Hackworth, MD [facility] 09/17/2022

## 2022-10-15 DEATH — deceased
